# Patient Record
Sex: Female | Born: 1975 | Race: White | Smoking: Former smoker
Health system: Northeastern US, Academic
[De-identification: ages and names within clinical notes are randomized; demographics above are authoritative.]

## PROBLEM LIST (undated history)

## (undated) DIAGNOSIS — F32A Depression, unspecified: Secondary | ICD-10-CM

## (undated) DIAGNOSIS — K219 Gastro-esophageal reflux disease without esophagitis: Secondary | ICD-10-CM

## (undated) DIAGNOSIS — R0683 Snoring: Secondary | ICD-10-CM

## (undated) DIAGNOSIS — R5383 Other fatigue: Secondary | ICD-10-CM

## (undated) DIAGNOSIS — R609 Edema, unspecified: Secondary | ICD-10-CM

## (undated) DIAGNOSIS — R519 Headache, unspecified: Secondary | ICD-10-CM

## (undated) DIAGNOSIS — R760 Raised antibody titer: Secondary | ICD-10-CM

## (undated) DIAGNOSIS — Z9889 Other specified postprocedural states: Secondary | ICD-10-CM

## (undated) DIAGNOSIS — F419 Anxiety disorder, unspecified: Secondary | ICD-10-CM

## (undated) DIAGNOSIS — R7303 Prediabetes: Secondary | ICD-10-CM

## (undated) DIAGNOSIS — N393 Stress incontinence (female) (male): Secondary | ICD-10-CM

## (undated) DIAGNOSIS — T8859XA Other complications of anesthesia, initial encounter: Secondary | ICD-10-CM

## (undated) DIAGNOSIS — R0602 Shortness of breath: Secondary | ICD-10-CM

## (undated) DIAGNOSIS — R112 Nausea with vomiting, unspecified: Secondary | ICD-10-CM

## (undated) DIAGNOSIS — G4730 Sleep apnea, unspecified: Secondary | ICD-10-CM

## (undated) DIAGNOSIS — H539 Unspecified visual disturbance: Secondary | ICD-10-CM

## (undated) HISTORY — PX: COLONOSCOPY: SHX174

## (undated) HISTORY — DX: Shortness of breath: R06.02

## (undated) HISTORY — DX: Snoring: R06.83

## (undated) HISTORY — DX: Anxiety disorder, unspecified: F41.9

## (undated) HISTORY — DX: Edema, unspecified: R60.9

## (undated) HISTORY — DX: Depression, unspecified: F32.A

## (undated) HISTORY — DX: Morbid (severe) obesity due to excess calories: E66.01

## (undated) HISTORY — DX: Other fatigue: R53.83

## (undated) HISTORY — DX: Stress incontinence (female) (male): N39.3

## (undated) HISTORY — DX: Unspecified visual disturbance: H53.9

## (undated) HISTORY — DX: Headache, unspecified: R51.9

## (undated) HISTORY — DX: Other complications of anesthesia, initial encounter: T88.59XA

## (undated) HISTORY — DX: Gastro-esophageal reflux disease without esophagitis: K21.9

## (undated) HISTORY — DX: Sleep apnea, unspecified: G47.30

## (undated) HISTORY — DX: Prediabetes: R73.03

## (undated) HISTORY — DX: Raised antibody titer: R76.0

---

## 2001-03-10 HISTORY — PX: BREAST SURGERY: SHX581

## 2001-07-08 HISTORY — PX: BREAST REDUCTION SURGERY: SHX8

## 2009-03-10 DIAGNOSIS — I471 Supraventricular tachycardia: Secondary | ICD-10-CM

## 2009-03-10 HISTORY — DX: Supraventricular tachycardia: I47.1

## 2012-03-10 HISTORY — PX: EYE SURGERY: SHX253

## 2013-09-06 ENCOUNTER — Ambulatory Visit: Payer: Self-pay | Admitting: Surgery

## 2013-09-29 ENCOUNTER — Ambulatory Visit: Payer: Self-pay | Admitting: Surgery

## 2013-09-29 ENCOUNTER — Encounter: Payer: Self-pay | Admitting: Surgery

## 2013-09-29 ENCOUNTER — Ambulatory Visit
Admit: 2013-09-29 | Discharge: 2013-09-29 | Disposition: A | Discharge status: Home / Self Care | Payer: Self-pay | Source: Ambulatory Visit | Attending: Surgery | Admitting: Surgery

## 2013-09-29 DIAGNOSIS — G4730 Sleep apnea, unspecified: Secondary | ICD-10-CM

## 2013-09-29 LAB — TSH: TSH: 1.73 u[IU]/mL (ref 0.27–4.20)

## 2013-09-29 NOTE — H&P (Signed)
[facility] at [facility]  History and Physical    Name: Brooke Hanson  MRN: [id number]  DOB: 1975-12-02  Date of Encounter: September 29, 2013  Medical Providers:  Referring: Levy SjogrenGraff, Kerry W  PCP: Levy SjogrenGraff, Kerry W (General)      CC:  Morbid obesity with desire for weight loss    HPI:  Brooke Hanson is a 38 y.o. female who presents at the request of Levy SjogrenGraff, Kerry W [facility](General) for bariatric surgical consultation.  Her body mass index is 38 kg/(m^2).Marland Kitchen. The patient presents with obesity.  The severity is morbid.  The location is central and peripheral.  The duration has been greater than 5 years.  The quality has been stable.  She has failed medical treatment. She does have these associated symptoms obstructive sleep apnea, depression, anxiety  .    Desired procedure: The patient is interested in Laparoscopic Sleeve Gastrectomy       with Sol PasserWilliam E. Gershon Mussel'Malley, M.D., F.A.C.S.  .    Medical Diagnoses:  1. Morbid obesity    2. Sleep apnea         PMH:  Past Medical History   Diagnosis Date    Morbid obesity     Sleep apnea     Supraventricular tachycardia 2011    Depression     Anxiety     Snoring     Fatigue     Complication of anesthesia      nausea and emesis    Peripheral edema      during pregnancy       Weight History:   Brooke Hanson has been obese:  Obesity History 09/16/2013   Obesity History Morbidly Obese >5 years;Obese since childhood        Weight loss programs/medications:   Brooke Hanson has attempted the following weight loss programs and medications in the past:  Weight Loss Diets 09/16/2013   Weight Loss Diets Weight Watchers;Other        Weight Loss Medications 09/16/2013   Weight Loss Medications (No Data)       Exercise History:   Excercise History 09/16/2013   Overweight At Least 5 Years Yes   Currently Excercising Yes   Type of Activity Other   Other Activity tall trainer   Frequency (Per Week) 3   Duration (Minutes) 60   Functional Limits No Impairment (Can Walk 200 ft Without Assistance)         PSH:  Past Surgical History   Procedure Laterality Date    Eye surgery Bilateral 2014     tear duct     Breast surgery  2003     reduction        Current Medications, Vitamins, and Supplements list:  Medications/Vitamins/Supplements        Last Dose Start Date End Date Provider     atenolol (TENORMIN) 50 MG tablet Taking  08/11/13  --  [provider]     50 mg as needed        cetirizine (ZYRTEC) 10 MG tablet Taking  --  --  [provider]     Take 10 mg by mouth as needed for Allergies     citalopram (CELEXA) 40 MG tablet Taking  08/11/13  --  [provider]     40 mg daily        ibuprofen (ADVIL) 200 MG tablet Taking  --  --  [provider]     Take 200 mg by mouth as  needed for Pain          Allergies:  Allergies   Allergen Reactions    Seasonal Other (See Comments)     Watery eyes runny nose and sneezing       Disability History:   Disability 09/16/2013   Is the Patient Disabled? No   Hearing Impaired? No       Family History:  Family History   Problem Relation Age of Onset    High blood pressure Mother     Anesth problems Mother     Arthritis Mother     Obesity Mother     Cancer Paternal Grandmother      breast    Morbid Obesity Paternal Grandmother     Aneurysm Paternal Grandfather      heart    Heart disease Paternal Grandfather     Morbid Obesity Paternal Grandfather     Arthritis Maternal Grandfather     COPD Maternal Grandfather     Depression Maternal Grandfather     Stroke Maternal Grandfather     Mental illness Father     High blood pressure Maternal Grandmother         Social History:  Brooke Hanson currently resides in [place] [address].  Occupation:   Marital Status: Divorced  Children:   Support Person: [name] mother   Tobacco History: She  reports that she quit smoking about 16 years ago. She has never used smokeless tobacco.  Alcohol History: Brooke Hanson  reports that she drinks alcohol.  Ellicit drug use: She  reports that she does  not use illicit drugs.     Review of Systems:  Review of Systems   Constitutional: Negative for fever, chills and diaphoresis.   HENT: Negative for hearing loss, nosebleeds and tinnitus.    Eyes: Negative for blurred vision and double vision.   Respiratory: Negative for cough, shortness of breath and wheezing.    Cardiovascular: Negative for chest pain, palpitations and leg swelling.   Gastrointestinal: Negative for heartburn, nausea, vomiting, abdominal pain, diarrhea, constipation and blood in stool.   Genitourinary: Negative for dysuria, urgency and frequency.   Musculoskeletal: Positive for back pain. Negative for myalgias, joint pain and neck pain.   Skin: Negative for itching and rash.   Neurological: Positive for headaches. Negative for dizziness, tingling, tremors, seizures and loss of consciousness.   Psychiatric/Behavioral: Negative for depression and substance abuse. The patient is not nervous/anxious and does not have insomnia.        Physical Exam:  Blood pressure 123/63, pulse 71, temperature 36.3 C (97.3 F), resp. rate 18, height 1.549 m (5\' 1" ), weight 91.173 kg (201 lb), SpO2 99 %.   Percent Excess Weight Loss: 0 Percent     Physical Exam   Constitutional: She is oriented to person, place, and time. She appears well-developed and well-nourished.   HENT:   Head: Normocephalic and atraumatic.   Eyes: Conjunctivae are normal. Pupils are equal, round, and reactive to light.   Neck: Normal range of motion. Neck supple. No tracheal deviation present. No thyromegaly present.   Cardiovascular: Normal rate, regular rhythm and normal heart sounds.    No murmur heard.  Pulmonary/Chest: Effort normal and breath sounds normal. No respiratory distress. She has no wheezes. She has no rales.   Abdominal: Soft. Bowel sounds are normal. She exhibits no distension and no mass. There is no tenderness. There is no rebound.   Musculoskeletal: Normal range of motion. She exhibits no edema.  Lymphadenopathy:     She has  no cervical adenopathy.   Neurological: She is alert and oriented to person, place, and time.   Skin: Skin is warm and dry. No rash noted. No erythema.   Psychiatric: She has a normal mood and affect. Her behavior is normal. Thought content normal.        Assessment:  Brooke Hanson is a 38 y.o. female who presents for bariatric surgery.   Planned Procedure(s): Laparoscopic Sleeve Gastrectomy.     Documentation Reviewed:  I have reviewed the required letter of support from the patients Primary Care Physician.    Prior Labs and Reports Reviewed  No results found.    Discussion:  After a thorough evaluation patient appears to be an appropriate candidate for weight loss surgery, pending results of requested testing and/or all requested support letters.  We had a brief discussion regarding the risks and benefits of weight loss surgery, answering all questions raised, and the patient voiced a clear understanding.  Further, more detailed discussion will take place during the consultation visit with the surgeon.     Plan:  Orders Placed This Encounter   Procedures    US abdominal complete    TSH    H. pylori antibody, IgG     Procedure: Laparoscopic Sleeve Gastrectomy         She was instructed to discontinue NSAIDS as pain medication after surgery due to risk of ulceration of the gastric pouch.  It was explained that ulcers can perforate, bleed, and/or cause strictures.  It was explained that Tylenol based medications are acceptable.  She was instructed to begin a daily multi vitamin regimen now and that she will be required to follow written instructions regarding a multi vitamin/supplementation regimen post surgery.  Brooke Hanson was instructed to bring her CPAP device with her on the day of surgery.    Return in about 1 week (around 10/06/2013).

## 2013-09-29 NOTE — Patient Instructions (Signed)
[facility]  [address]  [facility] [place], [place] [id number]    Phone: [phone]    Fax: [phone]  ______________________________________________________________________  Darrell JewelWilliam E. OMalley, MD, FACS Pamala DuffelJoseph Johnson, MD, FACS Lael E. Algis GreenhouseForbes, MD, FACS  Delight HohHeather Allerton, PA-C Theodoro ParmaElizabeth Hughes, FNP Rolland PorterJulie Anne Georgios Kina, [place] JerseyPA-C    Bariatric Center Pre-Operative Checklist    Name: Brooke HanksMichelle Hanson  DOB: 27-Dec-1975  Date: 09/29/2013    Thank you for choosing [facility].  In order to assess your candidacy for surgery, we will need you to complete our pre-operative program.  This program includes nutrition appointments, psychological evaluation and medical testing.  Please have the following completed prior to your appointment with your surgeon.     []  Support Letter from your Primary Care Physician    [x]  DONE    [x]  Schedule and complete your psychological evaluation (please refer to the green form)    []   Psych evaluation - DONE    [x]  Ultrasound of your gallbladder (Please call our radiology department at [phone]     to make an appointment)    []  Previous Cholecystectomy (Your gallbladder has previously been removed)    []  Nicotine screen []  Bloodwork drawn today  []  Please wait until you are 6 weeks         smoke free (lab slip given)    []  Sleep Study        []  Sleep Center will contact you to schedule  [] [facility]   [] [facility] [] ______               []  Call your PCP for a sleep study appointment       []  In progress - patient to forward report                []  Yearly follow up      [x]  Bring machine on the day of surgery  [x] C-PAP  []  Bi-Pap       []  if you are diagnosed with sleep apnea after your sleep study    []  Cardiac clearance []  Call your Cardiologist for an appointment []  Call your PCP for        an appointment    []  Endoscopy []  Report of most recent endoscopy    []  Colonoscopy []  Report of most recent colonoscopy [] recommended    []  Report of most recent Gyn  visits (within the past year) [] recommended    []  Report of most recent mammogram (within the past year) []  recommended    [x]  Bloodwork drawn today [x]  TSH [x]  H.Pylori []  HgbA1c          []  TSH previously drawn ______uIU/ml  on __ /___           []  patient to forward        []  H. Pylori previously drawn ______ on __/____               []  patient to forward        []  HgbA1c previously drawn  ______% on __/_____          []  patient to forward         If your blood work is normal you will not hear from us       If your TSH or HgbA1c is abnormal your results will be sent to your PCP       If you test positive for H. Pylori we will send prescriptions directly to your  pharmacy,       along with a letter to you in the mail    []  Other:    [x]  Start Multivitamin    [x]  Calcium Citrate (634m calcium/400IU Vit D 2x/day)    YOU CAN SCHEDULE YOUR CONSULTATION WITH YOUR SURGEON AFTER YOUR CHECKLIST AND NUTRITION REQUIREMENTS HAVE BOTH BEEN COMPLETED

## 2013-09-30 LAB — H. PYLORI ANTIBODY, IGG: H Pylori IgG: POSITIVE — AB

## 2013-10-05 ENCOUNTER — Other Ambulatory Visit: Payer: Self-pay | Admitting: Surgery

## 2013-10-05 ENCOUNTER — Encounter: Payer: Self-pay | Admitting: Surgery

## 2013-10-05 MED ORDER — MINOCYCLINE HCL 100 MG PO CAPS *I*
100.0000 mg | ORAL_CAPSULE | Freq: Two times a day (BID) | ORAL | Status: AC
Start: 2013-10-05 — End: 2013-10-19

## 2013-10-05 MED ORDER — METRONIDAZOLE 500 MG PO TABS *I*
500.0000 mg | ORAL_TABLET | Freq: Two times a day (BID) | ORAL | Status: AC
Start: 2013-10-05 — End: 2013-10-19

## 2013-10-05 MED ORDER — BISMUTH SUBSALICYLATE 262 MG/15ML PO SUSP *I*
30.0000 mL | Freq: Three times a day (TID) | ORAL | Status: AC
Start: 2013-10-05 — End: 2013-10-19

## 2013-10-07 ENCOUNTER — Encounter: Payer: Self-pay | Admitting: Gastroenterology

## 2014-02-14 ENCOUNTER — Other Ambulatory Visit: Payer: Self-pay | Admitting: Family

## 2014-02-14 LAB — URINALYSIS WITH REFLEX TO MICROSCOPIC
Bilirubin,Ur: NEGATIVE
Blood,UA: NEGATIVE
Glucose,UA: NEGATIVE mg/dL^mg/dL
Ketones, UA: NEGATIVE mg/dL^mg/dL
Leuk Esterase,UA: NEGATIVE
Nitrite,UA: NEGATIVE
Protein,UA: NEGATIVE mg/dL^mg/dL
Specific Gravity,UA: 1.014 (ref 1.005–1.030)
Urobilinogen,UA: 0.2 mg/dL^mg/dL
pH,UA: 6 (ref 5.0–8.0)

## 2014-02-15 LAB — HEPATITIS B SURFACE ANTIGEN: HBV S AG: NONREACTIVE

## 2014-02-15 LAB — VAGINITIS SCREEN: DNA PROBE

## 2014-02-15 LAB — RPR: RPR Screen: NONREACTIVE

## 2014-02-15 LAB — HEPATITIS C ANTIBODY: Hepatitis C Ab: <0.02 s/co ratio^s/co ratio (ref 0.00–0.79)

## 2014-02-16 LAB — HSV 2 ANTIBODY, IGG: HSV 2 IgG: 0.34 IV^IV

## 2014-02-16 LAB — HSV 1 ANTIBODY, IGG: HSV 1 IgG: 0.12 IV^IV

## 2015-03-13 ENCOUNTER — Other Ambulatory Visit: Payer: Self-pay | Admitting: Obstetrics and Gynecology

## 2015-03-14 LAB — HEPATITIS C ANTIBODY: Hepatitis C Ab: <0.02 s/co ratio^s/co ratio (ref 0.00–0.79)

## 2015-03-14 LAB — HEPATITIS B SURFACE ANTIBODY: Hep B S Ab: 10.84 mIU/mL^mIU/mL

## 2015-03-14 LAB — HEPATITIS B SURFACE ANTIGEN: HBV S AG: NONREACTIVE

## 2015-03-15 LAB — SYPHILIS SCREEN
Syphilis Screen: NEGATIVE
Syphilis Status: NONREACTIVE

## 2015-03-16 LAB — GYN CYTOLOGY

## 2015-04-13 LAB — HSV 2 ANTIBODY, IGG: HSV 2 IgG: 0.2 IV^IV

## 2015-04-13 LAB — HSV 1 ANTIBODY, IGG: HSV 1 IgG: 0.06 IV^IV

## 2015-04-25 ENCOUNTER — Other Ambulatory Visit: Payer: Self-pay | Admitting: Obstetrics and Gynecology

## 2015-04-26 LAB — SURGICAL PATHOLOGY

## 2015-11-05 ENCOUNTER — Other Ambulatory Visit: Payer: Self-pay | Admitting: Family Medicine

## 2015-11-05 LAB — LIPID PANEL
Chol/HDL Ratio: 4.8 ^^L — ABNORMAL HIGH (ref 1.0–3.5)
Cholesterol: 214 mg/dL — ABNORMAL HIGH (ref 0–199)
HDL: 45 mg/dL (ref 40–60)
LDL Calculated: 134 mg/dL — ABNORMAL HIGH (ref 0–129)
Triglycerides: 175 mg/dL — ABNORMAL HIGH (ref 0–149)

## 2015-11-05 LAB — CREATININE, SERUM
Creatinine: 0.82 mg/dL (ref 0.51–0.95)
GFR,Black: 95 mL/min/{1.73_m2}
GFR,Caucasian: 78 mL/min/{1.73_m2}

## 2015-11-05 LAB — SODIUM: Sodium: 137 mmol/L (ref 133–145)

## 2015-11-05 LAB — CBC AND DIFFERENTIAL
Baso # K/uL: 0.1 10*3/uL (ref 0.0–0.1)
Basophil %: 0.6 % (ref 0.1–1.2)
Eos # K/uL: 0.1 10*3/uL (ref 0.0–0.4)
Eosinophil %: 1.6 % (ref 0.7–5.8)
Hematocrit: 37.6 % (ref 34.1–44.9)
Hemoglobin: 13.3 g/dL (ref 11.2–15.7)
Immature Granulocytes Absolute: 0.06 10*3/uL (ref 0.0–0.2)
Immature Granulocytes: 0.7 % (ref 0.0–2.0)
Lymph # K/uL: 2.8 10*3/uL (ref 1.2–3.7)
Lymphocyte %: 32.6 % (ref 19.3–51.7)
MCH: 32.2 pg (ref 25.6–32.2)
MCHC: 35.4 g/dL (ref 32.2–35.5)
MCV: 91 fL (ref 79.4–94.8)
Mono # K/uL: 0.8 10*3/uL (ref 0.2–0.9)
Monocyte %: 8.8 % (ref 4.7–12.5)
Neut # K/uL: 4.8 10*3/uL (ref 1.6–6.0)
Nucl RBC # K/uL: 0 /100 WBC (ref 0.0–0.2)
Platelets: 260 10*3/uL (ref 182–369)
RBC Distribution Width-SD: 40.8 fL (ref 36.4–46.3)
RBC: 4.13 10*6/uL (ref 3.93–5.22)
RDW: 12.3 % (ref 11.7–14.4)
Seg Neut %: 55.7 % (ref 34.0–71.1)
WBC: 8.5 10*3/uL (ref 4.0–10.0)

## 2015-11-05 LAB — ALT: ALT: 23 U/L (ref 0–35)

## 2015-11-05 LAB — BUN: Lab: 16 mg/dL (ref 6–20)

## 2015-11-05 LAB — GLUCOSE: Glucose: 111 mg/dL — ABNORMAL HIGH (ref 60–99)

## 2015-11-05 LAB — TSH: TSH: 2.24 u[IU]/mL (ref 0.27–4.20)

## 2015-11-05 LAB — POTASSIUM: Potassium: 4.1 mmol/L (ref 3.4–4.7)

## 2016-03-05 ENCOUNTER — Other Ambulatory Visit: Payer: Self-pay

## 2016-03-12 LAB — GYN CYTOLOGY

## 2016-03-28 ENCOUNTER — Other Ambulatory Visit: Payer: Self-pay | Admitting: Gastroenterology

## 2016-03-28 LAB — INFLUENZA  A & B/RSV PCR
INFLUENZA B PCR: NEGATIVE ^^L
Influenza A PCR: NEGATIVE ^^L
RSV Rapid Antigen: NEGATIVE ^^L

## 2016-03-31 ENCOUNTER — Other Ambulatory Visit: Payer: Self-pay | Admitting: Family Medicine

## 2016-03-31 LAB — RAPID STREP SCREEN: Rapid Strep Group A Throat: NEGATIVE ^^L

## 2016-06-10 ENCOUNTER — Encounter: Payer: Self-pay | Admitting: Gastroenterology

## 2016-07-15 ENCOUNTER — Encounter: Payer: Self-pay | Admitting: Surgery

## 2016-07-24 ENCOUNTER — Encounter: Payer: Self-pay | Admitting: Gastroenterology

## 2016-07-24 ENCOUNTER — Encounter: Payer: Self-pay | Admitting: Surgery

## 2016-07-24 ENCOUNTER — Other Ambulatory Visit: Payer: Self-pay

## 2016-07-24 ENCOUNTER — Ambulatory Visit: Payer: No Typology Code available for payment source | Attending: Surgery | Admitting: Surgery

## 2016-07-24 DIAGNOSIS — K219 Gastro-esophageal reflux disease without esophagitis: Secondary | ICD-10-CM | POA: Insufficient documentation

## 2016-07-24 DIAGNOSIS — Z6841 Body Mass Index (BMI) 40.0 and over, adult: Secondary | ICD-10-CM | POA: Insufficient documentation

## 2016-07-24 DIAGNOSIS — G4730 Sleep apnea, unspecified: Secondary | ICD-10-CM | POA: Insufficient documentation

## 2016-07-24 DIAGNOSIS — R7303 Prediabetes: Secondary | ICD-10-CM | POA: Insufficient documentation

## 2016-07-24 LAB — TSH: TSH: 3.37 u[IU]/mL (ref 0.27–4.20)

## 2016-07-24 NOTE — Addendum Note (Signed)
Addended by: Lauree Yurick D on: 07/24/2016 08:58 AM     Modules accepted: Orders

## 2016-07-24 NOTE — H&P (Signed)
[facility]  History and Physical    Name: Brooke Hanson  MRN: [id number]  DOB: 12-27-1975  Date of Encounter: Jul 24, 2016  Medical Providers:  Referring: No ref. provider found  PCP: Levy SjogrenGraff, Kerry W, MD      CC:  Morbid obesity with desire for weight loss    HPI:  Brooke Hanson is a 41 y.o. female who presents at the request of Lind GuestGraff, Otho PerlKerry W, MD for bariatric surgical consultation.  Her body mass index is 43.05 kg/(m^2).Marland Kitchen. The patient presents with obesity.  The severity is morbid.  The location is central and peripheral.  The duration has been greater than 5 years.  The quality has been stable.  She has failed medical treatment. She does have these associated symptoms obstructive sleep apnea, depression, anxiety, GERD  .    Desired procedure: The patient is interested in Laparoscopic Roux-en-Y Gastric Bypass       with Sol PasserWilliam E. Gershon Mussel'Malley, M.D., F.A.C.S.  .    Medical Diagnoses:  1. Morbid obesity    2. Morbid obesity with BMI of 40.0-44.9, adult    3. Sleep apnea, unspecified type    4. Gastroesophageal reflux disease, esophagitis presence not specified    5. Prediabetes         PMH:  Past Medical History:   Diagnosis Date    Anxiety     Complication of anesthesia     nausea and emesis    Depression     Fatigue     GERD (gastroesophageal reflux disease)     Headache     Morbid obesity     Peripheral edema     during pregnancy    Positive H. pylori titer     treated - 2015    Prediabetes     Shortness of breath     Sleep apnea     Snoring     Stress incontinence     Supraventricular tachycardia 2011    Vision abnormalities        Weight History:   Brooke Hanson has been obese:  Obesity History 07/15/2016   Obesity History Morbidly Obese >5 years        Weight loss programs/medications:   Brooke Hanson has attempted the following weight loss programs and medications in the past:  Weight Loss Diets 07/15/2016   Weight Loss Diets Weight Watchers;Other        Weight Loss  Medications 09/16/2013   Weight Loss Medications (No Data)       Exercise History:   Excercise History 07/15/2016   Overweight At Least 5 Years Yes   Currently Excercising No   Type of Activity -   Other Activity -   Frequency (Per Week) -   Duration (Minutes) -   Functional Limits No Impairment (Can Walk 200 ft Without Assistance)        PSH:  Past Surgical History:   Procedure Laterality Date    BREAST SURGERY  2003    reduction    EYE SURGERY Bilateral 2014    tear duct         Current Medications, Vitamins, and Supplements list:  Medications/Vitamins/Supplements         Last Dose Start Date End Date Provider     Calcium Carbonate Antacid (TUMS E-X PO) Taking  --  --  [provider]     Take by mouth     cetirizine (ZYRTEC) 10 MG tablet As Needed  --  --  [provider]     Take 10 mg by mouth as needed for Allergies     citalopram (CELEXA) 40 MG tablet Taking  08/11/13  --  [provider]     40 mg daily        ibuprofen (ADVIL) 200 MG tablet As Needed  --  --  [provider]     Take 200 mg by mouth as needed for Pain          Allergies:  Allergies   Allergen Reactions    Seasonal Allergies Other (See Comments)     Watery eyes runny nose and sneezing       Disability History:   Disability 07/15/2016   Is the Patient Disabled? No   Hearing Impaired? No       Family History:  Family History   Problem Relation Age of Onset    High Blood Pressure Mother     Anesth problems Mother     Arthritis Mother     Obesity Mother     High cholesterol Mother     Aneurysm Paternal Grandfather      heart    Heart Disease Paternal Grandfather     Morbid Obesity Paternal Grandfather     Arthritis Paternal Grandfather     Early death Paternal Grandfather     High Blood Pressure Paternal Grandfather     Arthritis Maternal Grandfather     COPD Maternal Grandfather     Depression Maternal Grandfather     Stroke Maternal Grandfather     Asthma Maternal Grandfather     High Blood  Pressure Maternal Grandmother     Arthritis Maternal Grandmother     Cancer Paternal Grandmother      breast    Morbid Obesity Paternal Grandmother     Arthritis Paternal Grandmother     Mental illness Father         Social History:  Brooke Hanson currently resides in [place] [address].  Occupation:   Marital Status: Divorced  Children:   Support Person: [name] mother   Tobacco History: She  reports that she quit smoking about 19 years ago. She has never used smokeless tobacco.  Alcohol History: Brooke Hanson  reports that she drinks alcohol.  Ellicit drug use: She  reports that she does not use illicit drugs.     Review of Systems:  Review of Systems   Constitutional: Negative for chills, diaphoresis and fever.   HENT: Negative for hearing loss, nosebleeds and tinnitus.    Eyes: Negative for blurred vision and double vision.   Respiratory: Negative for cough, shortness of breath and wheezing.    Cardiovascular: Positive for leg swelling. Negative for chest pain and palpitations.   Gastrointestinal: Positive for heartburn and nausea. Negative for abdominal pain, blood in stool, constipation, diarrhea and vomiting.   Genitourinary: Negative for dysuria, frequency and urgency.   Musculoskeletal: Positive for back pain and joint pain. Negative for myalgias and neck pain.   Skin: Negative for itching and rash.   Neurological: Positive for headaches. Negative for dizziness, tingling, tremors, seizures and loss of consciousness.   Psychiatric/Behavioral: Negative for depression and substance abuse. The patient is not nervous/anxious and does not have insomnia.        Physical Exam:  Blood pressure 134/67, pulse 82, temperature 35.8 C (96.4 F), resp. rate 18, height 1.562 m (5' 1.5"), weight 105.1 kg (231 lb 9.6 oz), SpO2 97 %.   Percent Excess Weight  Loss: 32.74 Percent     Physical Exam   Constitutional: She is oriented to person, place, and time. She appears well-developed and well-nourished.   HENT:   Head:  Normocephalic and atraumatic.   Eyes: EOM are normal.   Neck: Normal range of motion. Neck supple. No tracheal deviation present. No thyromegaly present.   Cardiovascular: Normal rate, regular rhythm and normal heart sounds.    No murmur heard.  Pulmonary/Chest: Effort normal and breath sounds normal. No respiratory distress. She has no wheezes. She has no rales.   Breast reduction scars   Abdominal: Soft. Bowel sounds are normal. She exhibits no distension and no mass. There is no tenderness. There is no rebound.   Musculoskeletal: Normal range of motion. She exhibits no edema.   Lymphadenopathy:     She has no cervical adenopathy.   Neurological: She is alert and oriented to person, place, and time.   Skin: Skin is warm and dry. No rash noted. No erythema.   Psychiatric: She has a normal mood and affect. Her behavior is normal. Thought content normal.        Assessment:  Brooke Hanson is a 40 y.o. female who presents for bariatric surgery.   Planned Procedure(s): Laparoscopic Roux-en-Y Gastric Bypass.     Documentation Reviewed:  I have reviewed the required letter of support from the patients Primary Care Physician.    Prior Labs and Reports Reviewed    Lab Results   Component Value Date    HEL POS (!) 09/29/2013      Lab Results   Component Value Date    TSH 2.24 11/05/2015         Discussion:  After a thorough evaluation patient appears to be an appropriate candidate for weight loss surgery, pending results of requested testing and/or all requested support letters.  We had a brief discussion regarding the risks and benefits of weight loss surgery, answering all questions raised, and the patient voiced a clear understanding.  Further, more detailed discussion will take place during the consultation visit with the surgeon.     Plan:  Orders Placed This Encounter   Procedures    H. pylori antigen, stool    US abdominal complete    TSH    Hemoglobin A1c     Procedure: Laparoscopic Roux-en-Y Gastric Bypass          She was instructed to discontinue NSAIDS as pain medication after surgery due to risk of ulceration of the gastric pouch.  It was explained that ulcers can perforate, bleed, and/or cause strictures.  It was explained that Tylenol based medications are acceptable.  She was instructed to begin a daily multi vitamin regimen now and that she will be required to follow written instructions regarding a multi vitamin/supplementation regimen post surgery.  Brooke Hanson was instructed to bring her CPAP device with her on the day of surgery.    Return in about 4 weeks (around 08/21/2016).

## 2016-07-25 LAB — HEMOGLOBIN A1C: Hemoglobin A1C: 5.6 % (ref 4.0–6.0)

## 2016-07-29 ENCOUNTER — Encounter: Payer: Self-pay | Admitting: Surgery

## 2016-07-31 ENCOUNTER — Other Ambulatory Visit: Payer: Self-pay | Admitting: Gastroenterology

## 2016-07-31 DIAGNOSIS — Z9884 Bariatric surgery status: Secondary | ICD-10-CM

## 2016-07-31 DIAGNOSIS — K760 Fatty (change of) liver, not elsewhere classified: Secondary | ICD-10-CM

## 2016-07-31 DIAGNOSIS — R160 Hepatomegaly, not elsewhere classified: Secondary | ICD-10-CM

## 2016-09-02 ENCOUNTER — Encounter: Payer: Self-pay | Admitting: Registered"

## 2016-09-29 ENCOUNTER — Encounter: Payer: Self-pay | Admitting: Registered"

## 2016-10-02 ENCOUNTER — Ambulatory Visit: Payer: No Typology Code available for payment source | Attending: Surgery | Admitting: Surgery

## 2016-10-02 ENCOUNTER — Encounter: Payer: Self-pay | Admitting: Surgery

## 2016-10-02 VITALS — BP 128/81 | HR 94 | Temp 96.1°F | Resp 18 | Ht 61.5 in | Wt 229.2 lb

## 2016-10-02 DIAGNOSIS — Z6841 Body Mass Index (BMI) 40.0 and over, adult: Secondary | ICD-10-CM

## 2016-10-02 DIAGNOSIS — G4730 Sleep apnea, unspecified: Secondary | ICD-10-CM

## 2016-10-02 NOTE — H&P (Signed)
[facility] at [facility]  Surgeon's History and Physical    Name: Brooke Hanson  MRN: [id number]  DOB: March 24, 1975  Date of Encounter: October 02, 2016  Medical Providers:  Referring: No ref. provider found  PCP: Brooke Sjogren, MD    CC:  Morbid obesity with desire for weight loss surgery.    HPI:  Brooke Hanson is a 41 y.o. female who presents at the request of Brooke Guest Otho Perl, MD for bariatric surgical consultation.  Her body mass index is 42.61 kg/(m^2).Marland Kitchen The patient presents with obesity.  The severity is morbid.  The location is central and peripheral.  The duration has been greater than 5 years.  The quality has been stable.  She has failed medical treatment. She does have these associated symptoms obstructive sleep apnea, depression, anxiety, GERD  .    Desired procedure: The patient is interested in Laparoscopic Roux-en-Y Gastric Bypass       with Brooke Hanson. Brooke Hanson, M.D., F.A.C.S.  .    Weight History:   Ms. Hanson has been obese:  Obesity History 07/15/2016   Obesity History Morbidly Obese >5 years       PMH:  Past Medical History:   Diagnosis Date    Anxiety     Complication of anesthesia     nausea and emesis    Depression     Fatigue     GERD (gastroesophageal reflux disease)     Headache     Morbid obesity     Peripheral edema     during pregnancy    Positive H. pylori titer     treated - 2015    Prediabetes     Shortness of breath     Sleep apnea     Snoring     Stress incontinence     Supraventricular tachycardia 2011    Vision abnormalities         PSH:  Past Surgical History:   Procedure Laterality Date    BREAST SURGERY  2003    reduction    EYE SURGERY Bilateral 2014    tear duct         Current Medications, Vitamins, and Supplements list:  Current Outpatient Prescriptions   Medication    escitalopram (LEXAPRO) 20 MG tablet    Multiple Vitamins-Minerals (MULTI FOR HER 50+ PO)    omeprazole (PRILOSEC) 20 MG capsule    Calcium Carbonate Antacid (TUMS E-X PO)     cetirizine (ZYRTEC) 10 MG tablet    ibuprofen (ADVIL) 200 MG tablet     No current facility-administered medications for this visit.        Allergies:  Allergies   Allergen Reactions    Seasonal Allergies Other (See Comments)     Watery eyes runny nose and sneezing       Disability History:   Disability 07/15/2016   Is the Patient Disabled? No   Hearing Impaired? No       Family History:  Family History   Problem Relation Age of Onset    High Blood Pressure Mother     Anesth problems Mother     Arthritis Mother     Obesity Mother     High cholesterol Mother     Aneurysm Paternal Grandfather      heart    Heart Disease Paternal Grandfather     Morbid Obesity Paternal Grandfather     Arthritis Paternal Grandfather     Early death Paternal Grandfather  High Blood Pressure Paternal Grandfather     Arthritis Maternal Grandfather     COPD Maternal Grandfather     Depression Maternal Grandfather     Stroke Maternal Grandfather     Asthma Maternal Grandfather     High Blood Pressure Maternal Grandmother     Arthritis Maternal Grandmother     Cancer Paternal Grandmother      breast    Morbid Obesity Paternal Grandmother     Arthritis Paternal Grandmother     Mental illness Father         Social History:  [place] [id number].  Occupation:   Marital Status: Divorced  Children:   Support Person: Brooke Hanson mother   Tobacco History: She  reports that she quit smoking about 19 years ago. She has never used smokeless tobacco.  Alcohol History: Brooke Hanson  reports that she drinks alcohol.  Ellicit drug use: She  reports that she does not use illicit drugs.    Review of Systems:  Review of Systems   Constitutional: Negative.    HENT: Negative.    Eyes: Negative.    Respiratory: Negative.    Cardiovascular: Positive for leg swelling.        Feet   Gastrointestinal: Positive for heartburn. Negative for abdominal pain, blood in stool, constipation, diarrhea, nausea and  vomiting.   Genitourinary: Negative.    Musculoskeletal: Positive for back pain and joint pain.   Skin: Negative.    Neurological: Positive for headaches.   Endo/Heme/Allergies: Positive for environmental allergies.   Psychiatric/Behavioral: Negative.           Physical Exam:  Blood pressure 128/81, pulse 94, temperature 35.6 C (96.1 F), resp. rate 18, height 1.562 m (5' 1.5"), weight 104 kg (229 lb 3.2 oz), SpO2 94 %.   Percent Excess Weight Loss: 30.17 Percent     Physical Exam   Vitals reviewed.  Constitutional: She is oriented to person, place, and time. She appears well-developed and well-nourished. No distress.   HENT:   Head: Normocephalic.   Eyes: No scleral icterus.   Neck: Normal range of motion. Neck supple. No tracheal deviation present. No thyromegaly present.   Cardiovascular: Normal rate, regular rhythm and normal heart sounds.    No murmur heard.  Pulmonary/Chest: Effort normal and breath sounds normal. No respiratory distress. She has no wheezes. She has no rales.   Abdominal: Soft. Bowel sounds are normal. She exhibits no distension and no mass. There is no tenderness.   Musculoskeletal: She exhibits no edema or tenderness.   Lymphadenopathy:     She has no cervical adenopathy.   Neurological: She is alert and oriented to person, place, and time.   Skin: Skin is warm and dry.   Psychiatric: She has a normal mood and affect. Her behavior is normal. Judgment and thought content normal.        Assessment:  Brooke Hanson is a 41 y.o. female who presents for bariatric surgery.    After a thorough evaluation I believe that Brooke Hanson is an appropriate candidate for weight loss surgery due to her morbid obesity and multiple associated comorbid conditions: obstructive sleep apnea, depression, anxiety, GERD  .    Documentation Reviewed  I have reviewed the required letter of support from the patients Primary Care Physician.  I verified that the patient underwent the required psychological  evaluation and was deemed to be a good candidate for bariatric surgery.   I  have personally reviewed each Registered Dietitian(who are under my supervision) note and agree with the nutritional assessment of the Dietitian that this patient has satisfied all pre-surgery requirements including nutrition education on the bariatric lifestyle meal plan, an exercise regimen and behavior modification techniques.  I attest that this patient has completed the multi-disciplinary surgical preparatory regimen in order to improve surgical outcomes, reduce the potential for surgical complications, and establish the patients ability to comply with post-operative medical care and dietary restrictions.    Prior Tests and Reports Reviewed  Lab Results   Component Value Date    HEL POS (!) 09/29/2013      Lab Results   Component Value Date    TSH 3.37 07/24/2016     No results found for: NICOT, COTIN, 3OHCT   No results found.       All pertinent outside tests, labs, or preop clearances reviewed.      Plan:  No orders placed this visit      Planned Procedure: Laparoscopic Roux-en-Y Gastric Bypass             Pre-operative Counseling:        Brooke Hanson is requesting the laparoscopic Roux-en-Y Gastric Bypass.  She has previously participated in a 3 hour informational seminar  regarding the surgical treatment of Morbid Obesity. I described the  laparoscopic Roux-en-Y Gastric Bypass using lay terms and with the aid  of diagrams, describing the anatomic changes and physiologic impact of  the procedure. I have described the gastric bypass procedure in great  detail including the creation of a small proximal stomach which will  severely limit the ability to take food at any given time. I have explained  that overfilling this small stomach would acutely and reliably lead to  epigastric pain, nausea, vomiting, and over the long term would tend to  stretch out the proximal stomach and result in a decrease in weight loss  and/or in actual  weight gain. I have explained the preoperative,  perioperative and postoperative phases of the surgery. I also explained  the dietary advancement in the first few weeks and months after the  surgery. I have explained that the average result for this procedure, in this  protocol, is an excess body weight loss of 65-75% maintained at one and  two years and that this result is only possible if the patient is able to  adhere to the dietary restriction imposed by the procedure and adopts a  regular exercise program. We discussed the risks of the procedure, which  include but are not limited to, bleeding from the spleen and rarely  requiring splenectomy, wound complications such as infection,  dehiscence, or hernia formation which may require further surgical  intervention, cardiac complications such as heart attack or dysrhythmia,  pulmonary complications such as pneumonia or pulmonary embolism  secondary to deep venous thrombosis, anastomotic complications such  as leak, stricture, bowel obstruction, or ulceration which may require  further surgical therapy or other intervention. I have likened the risks of a  gastric bypass to that of an open cardiac bypass with a 0.1 % death rate,  and a 10% major complication rate. We discussed the possible  postoperative micronutrient deficiencies such as iron causing anemia, or  vitamin B12 which may require long-term supplementation, hair loss or  thinning during the first one to two years during the period of most rapid  weight loss. She is aware that she may be required to pay for  supplements  for protein, vitamin and mineral supplementation that may  not be covered by her insurance. We have described the fact that  following surgical weight loss, there can be significant hanging skin, which  may require plastic surgical intervention, which may or may not be  covered by insurance as well. After the above discussion, she asked  appropriate questions, which were answered to her  satisfaction.  She voiced a clear understanding that surgery; like dieting,  exercise, behavior modification, medications, or a combination thereof, is  not a cure for obesity, but a treatment option and that any weight loss as a  result of the surgery can be regained if a patient is not compliant with a  lifestyle that promotes healthy weight loss and weight loss maintenance.  She voiced a clear understanding of the importance of exercise  as an adjunct to a healthy diet in order to achieve maximum health and  weight loss benefits. She understands that if, at the time of  surgery, laparoscopy is not possible or feasible then the procedure may  need to be converted to an open procedure.  She appears to have reasonable expectations of the outcome of  the surgery and wishes to go forward with the laparoscopic Roux-en-Y  gastric bypass. An informed consent for the surgery will be obtained.  She was instructed to discontinue NSAIDS as pain medication after surgery due to risk of ulceration of the gastric pouch. It was explained that ulcers can perforate, bleed, and/or cause strictures. It was explained that Tylenol based medications are acceptable.  She was instructed to bring her CPAP device with her on the day of surgery.  She was instructed to begin a daily multivitamin regimen now and that she will be required to follow written instructions regarding a multi vitamin/mineral/protein supplementation regimen post-surgery.  Brooke Hanson voiced a clear understanding that surgery is a tool and that weight loss is not guaranteed but only seen in the context of appropriate use, follow up, and exercise.  Brooke Hanson was counselled regarding the following:  Patient was advised that standard pre-operative tests will be ordered prior to surgery and if any are found to be abnormal, he may need further testing, which may delay his surgery.  Patient was instructed to discontinue NSAIDS as pain medication after surgery due  to risk of ulceration of the gastric pouch.  It was explained that ulcers can perforate, bleed, and/or cause strictures.  It was explained that Tylenol based medications are acceptable.  The patient was instructed to begin a daily multivitamin regimen now and that she will be required to follow written instructions regarding a multivitamin/supplementation regimen post surgery.

## 2016-10-07 ENCOUNTER — Ambulatory Visit
Admission: RE | Admit: 2016-10-07 | Discharge: 2016-10-07 | Disposition: A | Discharge status: Home / Self Care | Payer: No Typology Code available for payment source | Source: Ambulatory Visit | Attending: Surgery | Admitting: Surgery

## 2016-10-07 ENCOUNTER — Other Ambulatory Visit: Payer: Self-pay | Admitting: Cardiology

## 2016-10-07 DIAGNOSIS — Z01810 Encounter for preprocedural cardiovascular examination: Secondary | ICD-10-CM

## 2016-10-07 DIAGNOSIS — I499 Cardiac arrhythmia, unspecified: Secondary | ICD-10-CM

## 2016-10-07 DIAGNOSIS — Z01818 Encounter for other preprocedural examination: Secondary | ICD-10-CM | POA: Insufficient documentation

## 2016-10-07 HISTORY — DX: Other specified postprocedural states: Z98.890

## 2016-10-07 HISTORY — DX: Nausea with vomiting, unspecified: R11.2

## 2016-10-07 LAB — COMPREHENSIVE METABOLIC PANEL
ALT: 23 U/L (ref 0–35)
AST: 20 U/L (ref 0–35)
Albumin: 3.9 g/dL (ref 3.5–5.2)
Alk Phos: 68 U/L (ref 35–105)
Anion Gap: 11 (ref 7–16)
Bilirubin,Total: 0.3 mg/dL (ref 0.0–1.2)
CO2: 26 mmol/L (ref 20–28)
Calcium: 8.6 mg/dL — ABNORMAL LOW (ref 8.8–10.2)
Chloride: 103 mmol/L (ref 96–108)
Creatinine: 0.79 mg/dL (ref 0.51–0.95)
GFR,Black: 109 *
GFR,Caucasian: 95 *
Globulin: 3.1 g/dL (ref 2.7–4.3)
Glucose: 102 mg/dL — ABNORMAL HIGH (ref 60–99)
Lab: 12 mg/dL (ref 6–20)
Potassium: 4.1 mmol/L (ref 3.3–5.1)
Sodium: 140 mmol/L (ref 133–145)
Total Protein: 7 g/dL (ref 6.3–7.7)

## 2016-10-07 LAB — HEMATOCRIT: Hematocrit: 37 % (ref 34–45)

## 2016-10-07 LAB — TYPE AND SCREEN
ABO RH Blood Type: O NEG
Antibody Screen: NEGATIVE

## 2016-10-07 LAB — MCHC: MCHC: 34 g/dL (ref 32–36)

## 2016-10-07 NOTE — Discharge Instructions (Signed)
Pre-Operative Instructions - Gastric Bypass / Gastrectomy Sleeve                            PRIOR TO SURGERY     NOW, please STOP taking if surgeon does not specify:     Anti-inflammatory medications (Ibuprofen, Motrin, Advil, Mobic, Meloxicam, Aleve, Naproxen, Voltaren, etc.)   Vitamins and herbal supplements, including herbal teas    YOU MAY TAKE ACETAMINOPHEN (TYLENOL) as needed    FOLLOW YOUR SURGEON'S INSTRUCTIONS IF DIFFERENT THAN ABOVE.    DAY BEFORE SURGERY-7-31     Call [phone] between 2PM and 4PM to receive your arrival/surgery time.  Call Friday afternoon if your surgery is on Monday.   On waking, start a CLEAR liquid diet (broth, tea, coffee, juice, jello, water). You may remain on a clear liquid diet until 12:00 midnight.   Do not eat or drink anything (including candy or gum) after midnight the night before your surgery.    DAY OF SURGERY-8-1     If you are female, please be prepared to provide a urine sample on arrival to the preoperative area unless you are one year post menopause or have had a hysterectomy.    DO NOT WEAR: JEWELRY, MAKEUP, DARK NAIL POLISH, HAIR PINS, BODY LOTION OR SCENTS.     Please understand that rings and body piercings must be removed prior to surgery.  If they are not removed your surgery is at risk of being delayed or cancelled. Please see a jeweler before your surgery if you cannot remove an item yourself.   Please do not bring valuables/electronics/cash with you on the day of surgery. [facility] is not responsible for damage/loss/theft of these items. Thank you.If wearing eyeglasses, please bring a case.  DO NOT WEAR CONTACT LENSES.   You may shower, brush your teeth, and use deodorant.   PLEASE BRING YOUR CPAP MACHINE INTO THE PREOPERATIVE AREA TO BE CHECKED     MEDICATIONS: DAY OF SURGERY     Take your medications as directed on the  medication list.   Anxiety and pain medications may be taken as prescribed at any time prior to arrival.    Bring your inhalers and eye drops on the day of surgery to be used as prescribed.   All other medications will be administered to you from [facility] under the direction of your surgeon.    AT THE HOSPITAL ON THE DAY OF SURGERY     Park in the Main Ramp garage.  Enter the building through the [employer].  Please stop at the Information Desk so that they may direct you to Surgery Center on [address].   Leave your belongings in the car (except for your CPAP) and your visitors may bring them to your room after surgery.    [facility]    As a convenience, prior to your discharge we will provide any discharge medications you require.      The pharmacy is open from 9am to 5:30pm on weekdays and 10am-2pm on Saturdays.     If you will be alone on the day of surgery and want to leave with your prescribed medication you may:     Bring a check made out to [facility]   Use a credit card to pay for your prescription   Have your family call the pharmacy with a credit card number   Only bring cash to the hospital  as a last resort. Prescriptions cannot be filled without payment.  Thank you for your consideration.    QUESTIONS?     Question about these instructions? Call [phone], select option 2, and leave a message for a nurse to return your call.   Any questions regarding specifics about your surgery or recovery? Please call your surgeons office.

## 2016-10-07 NOTE — Plan of Care (Signed)
Problem: Knowledge deficit related to pre or post-op regimens  Goal: Patient verbalizes understanding of PACU teaching  Outcome: Completed or Resolved Date Met: 10/07/16

## 2016-10-07 NOTE — Anesthesia Preprocedure Evaluation (Addendum)
Anesthesia Pre-operative History and Physical for Brooke Hanson  History and Physical Performed at [facility])    Electrophysiology/AICD/Pacer:  EKG 10/07/2016    CPM Summary:  Brooke Hanson presents preoperatively for anesthesia evaluation prior to Lap Gastric Bypass by Dr. Ernestine ConradMalley, BMI 43.1, s/p 9# intentional weight loss in prep for bariatric surgery. She has a past medical history of Anxiety; Complication of anesthesia; Depression; Fatigue; GERD (gastroesophageal reflux disease); Headache; Morbid obesity; Peripheral edema; PONV (postoperative nausea and vomiting); Positive H. pylori titer; Prediabetes; Shortness of breath; Sleep apnea; Stress incontinence; Supraventricular tachycardia (2011).     Exercise tolerance:  The patient reports occas SOB w/ exertion if carrying a "heavy box", denies chest pain, is moderately active(works full time office job, does ADL's/gardening/housework), climbs stairs and can lie flat.  Limited procedures, reports PONV, denies any other personal/ family issues w/ anesthesia. Denies any cardiovascular issues.By ANN Ann HeldS CHATEAUNEUF, PA at 9:04 AM on 10/07/2016    Anesthesia Evaluation Information Source: patient, records     ANESTHESIA HISTORY    + history of anesthetic complications          PONV  Pertinent(-):  No Family hx of anesthetic complications    GENERAL    + Obesity          central  Comment: Denies any open sores, wounds or rashes at preop.  Pertinent (-):  No substance abuse    HEENT  Pertinent (-):  No hearing loss or neck pain PULMONARY    + Shortness of Breath (Only w/ exertion of carrying heavy boxes)    + Currently Active Environmental Allergies    + Snoring    + Sleep apnea          CPAPEPAP auto cmH2O  Pertinent(-):  No smoking, asthma, recent URI, cough/congestion, pulmonary testing or COPD    CARDIOVASCULAR  Good(4+METs) Exercise Tolerance    + Hx of Dysrhythmias          SVT  Pertinent(-):  No hypertension, past MI, cardiac testing or hx of  DVT    GI/HEPATIC/RENAL  Last PO Intake: >8hr before procedure and >2hr before procedure (clears)    + GERD          well controlled    + Alcohol use          social  Pertinent(-):  No liver  issues or renal issues NEURO/PSYCH    + Headaches          within last week and migraines    + Psychiatric Issues          depression  Pertinent(-):  No dizziness/motion sickness, syncope, seizures, cerebrovascular event, neuromuscular disease, gait/mobility issues or positioning issues    ENDO/OTHER    + Diabetes Mellitus (Prediabetes, no meds)    + Hormone Use          hormonal IUD  Pertinent(-):  No thyroid disease    HEMATOLOGIC  Pertinent(-):  No bruising/bleeding easily, coagulopathy or anticoagulants/antiplatelet medications       Physical Exam    Airway            Mouth opening: normal            Mallampati: II            TM distance (fb): >3 FB            Neck ROM: full            Comment: Thick neck/tongue  Dental  Normal Exam   Cardiovascular  Normal Exam           Rhythm: regular           Rate: normal  No friction rub, systolic click, weak pulses, peripheral edema, murmur or cardiac device sound      Neurologic    Normal Exam  No sensory deficit and motor deficit    General Survey    Normal Exam  No rashes, wounds   Pulmonary   Normal Exam    breath sounds clear to auscultation    No cough, rhonchi, decreased breath sounds, wheezes, rales    Mental Status   Normal Exam    oriented to person, place and time       ________________________________________________________________________  PLAN  ASA Score  3  Anesthetic Plan general    Protocols/Care Pathways (Bariatric Surgery) Induction (routine IV) General Anesthesia/Sedation Maintenance Plan (inhaled agents);  Airway Manipulation (direct laryngoscopy); Airway (cuffed ETT); Line ( use current access); Monitoring (standard ASA); Positioning (supine and arms out); PONV Plan (dexamethasone, ondansetron, haloperidol and promethazine); Pain (per surgical team and intraop  local); PostOp (PACU)    Informed Consent     Risks:          Risks discussed were commensurate with the plan listed above with the following specific points: aspiration, N/V and sore throat , damage to:(eyes, nerves, teeth), allergic Rx, awareness, unexpected serious injury    Anesthetic Consent:         Anesthetic plan (and risks as noted above) were discussed with patient and mother    Plan also discussed with team members including:       surgeon    Attending Attestation:  As the primary attending anesthesiologist, I attest that the patient or proxy understands and accepts the risks and benefits of the anesthesia plan. I also attest that I have personally performed a pre-anesthetic examination and evaluation, and prescribed the anesthetic plan for this particular location within 48 hours prior to the anesthetic as documented. Harley AltoPamela Ladaija Dimino, MD 12:03 PM

## 2016-10-08 ENCOUNTER — Inpatient Hospital Stay: Payer: No Typology Code available for payment source | Admitting: Gastroenterology

## 2016-10-08 ENCOUNTER — Encounter
Admission: RE | Disposition: A | Discharge status: Home / Self Care | Payer: Self-pay | Source: Ambulatory Visit | Attending: Surgery

## 2016-10-08 ENCOUNTER — Encounter: Payer: Self-pay | Admitting: Anesthesiology

## 2016-10-08 ENCOUNTER — Inpatient Hospital Stay: Payer: No Typology Code available for payment source | Admitting: Anesthesiology

## 2016-10-08 ENCOUNTER — Inpatient Hospital Stay
Admission: RE | Admit: 2016-10-08 | Discharge: 2016-10-10 | DRG: 403 | Disposition: A | Discharge status: Home / Self Care | Payer: No Typology Code available for payment source | Source: Ambulatory Visit | Attending: Surgery | Admitting: Surgery

## 2016-10-08 DIAGNOSIS — R7303 Prediabetes: Secondary | ICD-10-CM | POA: Diagnosis present

## 2016-10-08 DIAGNOSIS — R0602 Shortness of breath: Secondary | ICD-10-CM | POA: Diagnosis present

## 2016-10-08 DIAGNOSIS — I471 Supraventricular tachycardia: Secondary | ICD-10-CM | POA: Diagnosis present

## 2016-10-08 DIAGNOSIS — G43909 Migraine, unspecified, not intractable, without status migrainosus: Secondary | ICD-10-CM | POA: Diagnosis present

## 2016-10-08 DIAGNOSIS — G4733 Obstructive sleep apnea (adult) (pediatric): Secondary | ICD-10-CM | POA: Diagnosis present

## 2016-10-08 DIAGNOSIS — K760 Fatty (change of) liver, not elsewhere classified: Secondary | ICD-10-CM

## 2016-10-08 DIAGNOSIS — Z6841 Body Mass Index (BMI) 40.0 and over, adult: Secondary | ICD-10-CM

## 2016-10-08 DIAGNOSIS — F1010 Alcohol abuse, uncomplicated: Secondary | ICD-10-CM | POA: Diagnosis present

## 2016-10-08 DIAGNOSIS — J309 Allergic rhinitis, unspecified: Secondary | ICD-10-CM | POA: Diagnosis present

## 2016-10-08 DIAGNOSIS — F329 Major depressive disorder, single episode, unspecified: Secondary | ICD-10-CM | POA: Diagnosis present

## 2016-10-08 DIAGNOSIS — K219 Gastro-esophageal reflux disease without esophagitis: Secondary | ICD-10-CM | POA: Diagnosis present

## 2016-10-08 HISTORY — PX: PR LAPS GSTR RSTCV PX W/BYP ROUX-EN-Y LIMB <150 CM: 43644

## 2016-10-08 HISTORY — PX: PR LAP GASTRIC BYPASS/ROUX-EN-Y: 43644

## 2016-10-08 LAB — POCT URINE PREGNANCY: Lot #: 7110176

## 2016-10-08 LAB — EKG 12-LEAD
P: 48 deg
PR: 156 ms
QRS: 22 deg
QRSD: 96 ms
QT: 392 ms
QTc: 450 ms
Rate: 79 {beats}/min
Severity: NORMAL
T: 38 deg

## 2016-10-08 LAB — POCT GLUCOSE
Glucose POCT: 80 mg/dL (ref 60–99)
Glucose POCT: 129 mg/dL — ABNORMAL HIGH (ref 60–99)
Glucose POCT: 140 mg/dL — ABNORMAL HIGH (ref 60–99)

## 2016-10-08 SURGERY — CREATION, GASTRIC BYPASS, LAPAROSCOPIC
Anesthesia: General | Site: Abdomen | Wound class: Clean Contaminated

## 2016-10-08 MED ORDER — GLUCOSE 40 % PO GEL *I*
15.0000 g | ORAL | Status: DC | PRN
Start: 2016-10-08 — End: 2016-10-10

## 2016-10-08 MED ORDER — DEXAMETHASONE SODIUM PHOSPHATE 4 MG/ML INJ SOLN *WRAPPED*
INTRAMUSCULAR | Status: DC | PRN
Start: 2016-10-08 — End: 2016-10-08
  Administered 2016-10-08: 4 mg via INTRAVENOUS

## 2016-10-08 MED ORDER — KETOROLAC TROMETHAMINE 30 MG/ML IJ SOLN *I*
INTRAMUSCULAR | Status: DC | PRN
Start: 2016-10-08 — End: 2016-10-08
  Administered 2016-10-08: 30 mg via INTRAVENOUS

## 2016-10-08 MED ORDER — KETOROLAC TROMETHAMINE 30 MG/ML IJ SOLN *I*
15.0000 mg | Freq: Four times a day (QID) | INTRAMUSCULAR | Status: DC
Start: 2016-10-08 — End: 2016-10-10
  Administered 2016-10-08 – 2016-10-10 (×7): 15 mg via INTRAVENOUS
  Filled 2016-10-08 (×7): qty 1

## 2016-10-08 MED ORDER — POTASSIUM CHLORIDE IN NACL 20-0.45 MEQ/L-% IV SOLN *I*
75.0000 mL/h | INTRAVENOUS | Status: AC
Start: 2016-10-08 — End: 2016-10-09
  Administered 2016-10-08: 125 mL/h via INTRAVENOUS
  Administered 2016-10-09 (×2): 75 mL/h via INTRAVENOUS
  Administered 2016-10-09: 125 mL/h via INTRAVENOUS

## 2016-10-08 MED ORDER — GLUCAGON HCL (RDNA) 1 MG IJ SOLR *WRAPPED*
1.0000 mg | INTRAMUSCULAR | Status: DC | PRN
Start: 2016-10-08 — End: 2016-10-10

## 2016-10-08 MED ORDER — KETOROLAC TROMETHAMINE 30 MG/ML IJ SOLN *I*
INTRAMUSCULAR | Status: AC
Start: 2016-10-08 — End: 2016-10-08
  Filled 2016-10-08: qty 1

## 2016-10-08 MED ORDER — HALOPERIDOL LACTATE 5 MG/ML IJ SOLN *I*
0.5000 mg | Freq: Once | INTRAMUSCULAR | Status: DC | PRN
Start: 2016-10-08 — End: 2016-10-08

## 2016-10-08 MED ORDER — ONDANSETRON HCL 2 MG/ML IV SOLN *I*
INTRAMUSCULAR | Status: AC
Start: 2016-10-08 — End: 2016-10-08
  Filled 2016-10-08: qty 2

## 2016-10-08 MED ORDER — AMPICILLIN-SULBACTAM IN NS 3 GM *I*
3000.0000 mg | Freq: Four times a day (QID) | INTRAMUSCULAR | Status: AC
Start: 2016-10-08 — End: 2016-10-09
  Administered 2016-10-08 – 2016-10-09 (×3): 3000 mg via INTRAVENOUS
  Filled 2016-10-08 (×3): qty 100

## 2016-10-08 MED ORDER — HYDROMORPHONE HCL PF 0.5 MG/0.5 ML IJ SOLN *I*
0.5000 mg | INTRAMUSCULAR | Status: DC | PRN
Start: 2016-10-08 — End: 2016-10-08

## 2016-10-08 MED ORDER — PROMETHAZINE HCL 25 MG/ML IJ SOLN *I*
INTRAMUSCULAR | Status: DC | PRN
Start: 2016-10-08 — End: 2016-10-08
  Administered 2016-10-08: 12.5 mg via INTRAVENOUS

## 2016-10-08 MED ORDER — PANTOPRAZOLE SODIUM 40 MG IV SOLR *I*
40.0000 mg | Freq: Every day | INTRAVENOUS | Status: AC
Start: 2016-10-08 — End: 2016-10-08
  Administered 2016-10-08: 40 mg via INTRAVENOUS
  Filled 2016-10-08: qty 10

## 2016-10-08 MED ORDER — FENTANYL CITRATE 50 MCG/ML IJ SOLN *WRAPPED*
INTRAMUSCULAR | Status: AC
Start: 2016-10-08 — End: 2016-10-08
  Filled 2016-10-08: qty 2

## 2016-10-08 MED ORDER — BUPIVACAINE HCL 0.25 % IJ SOLUTION *WRAPPED*
Status: DC | PRN
Start: 2016-10-08 — End: 2016-10-08
  Administered 2016-10-08: 30 mL via SUBCUTANEOUS

## 2016-10-08 MED ORDER — LACTATED RINGERS IV SOLN *I*
125.0000 mL/h | INTRAVENOUS | Status: DC
Start: 2016-10-08 — End: 2016-10-08
  Administered 2016-10-08: 125 mL/h via INTRAVENOUS

## 2016-10-08 MED ORDER — MEPERIDINE HCL 25 MG/ML IJ SOLN *I*
12.5000 mg | INTRAMUSCULAR | Status: DC | PRN
Start: 2016-10-08 — End: 2016-10-08

## 2016-10-08 MED ORDER — PROPOFOL 10 MG/ML IV EMUL (INTERMITTENT DOSING) WRAPPED *I*
INTRAVENOUS | Status: AC
Start: 2016-10-08 — End: 2016-10-08
  Filled 2016-10-08: qty 20

## 2016-10-08 MED ORDER — FENTANYL CITRATE 50 MCG/ML IJ SOLN *WRAPPED*
INTRAMUSCULAR | Status: DC | PRN
Start: 2016-10-08 — End: 2016-10-08
  Administered 2016-10-08: 100 ug via INTRAVENOUS

## 2016-10-08 MED ORDER — LIDOCAINE HCL 2 % IJ SOLN *I*
INTRAMUSCULAR | Status: DC | PRN
Start: 2016-10-08 — End: 2016-10-08
  Administered 2016-10-08: 100 mg via INTRAVENOUS

## 2016-10-08 MED ORDER — PROMETHAZINE HCL 25 MG/ML IJ SOLN *I*
INTRAMUSCULAR | Status: AC
Start: 2016-10-08 — End: 2016-10-08
  Filled 2016-10-08: qty 1

## 2016-10-08 MED ORDER — ONDANSETRON HCL 2 MG/ML IV SOLN *I*
4.0000 mg | INTRAMUSCULAR | Status: AC | PRN
Start: 2016-10-08 — End: 2016-10-09
  Administered 2016-10-08 – 2016-10-09 (×2): 4 mg via INTRAVENOUS
  Filled 2016-10-08 (×2): qty 2

## 2016-10-08 MED ORDER — HEPARIN SODIUM 5000 UNIT/ML SQ *I*
5000.0000 [IU] | Freq: Once | SUBCUTANEOUS | Status: AC
Start: 2016-10-08 — End: 2016-10-08
  Administered 2016-10-08: 5000 [IU] via SUBCUTANEOUS
  Filled 2016-10-08: qty 1

## 2016-10-08 MED ORDER — SODIUM CHLORIDE 0.9 % IV SOLN WRAPPED *I*
20.0000 mL/h | Status: DC
Start: 2016-10-08 — End: 2016-10-08

## 2016-10-08 MED ORDER — BUPIVACAINE HCL 0.25 % IJ SOLUTION *WRAPPED*
Status: AC
Start: 2016-10-08 — End: 2016-10-08
  Filled 2016-10-08: qty 30

## 2016-10-08 MED ORDER — HALOPERIDOL LACTATE 5 MG/ML IJ SOLN *I*
INTRAMUSCULAR | Status: AC
Start: 2016-10-08 — End: 2016-10-08
  Filled 2016-10-08: qty 1

## 2016-10-08 MED ORDER — ONDANSETRON HCL 2 MG/ML IV SOLN *I*
INTRAMUSCULAR | Status: DC | PRN
Start: 2016-10-08 — End: 2016-10-08
  Administered 2016-10-08: 4 mg via INTRAMUSCULAR

## 2016-10-08 MED ORDER — LACTATED RINGERS IV SOLN *I*
20.0000 mL/h | INTRAVENOUS | Status: DC
Start: 2016-10-08 — End: 2016-10-08
  Administered 2016-10-08: 20 mL/h via INTRAVENOUS

## 2016-10-08 MED ORDER — DEXTROSE 50 % IV SOLN *I*
25.0000 g | INTRAVENOUS | Status: DC | PRN
Start: 2016-10-08 — End: 2016-10-10

## 2016-10-08 MED ORDER — PROPOFOL 10 MG/ML IV EMUL (INTERMITTENT DOSING) WRAPPED *I*
INTRAVENOUS | Status: DC | PRN
Start: 2016-10-08 — End: 2016-10-08
  Administered 2016-10-08: 150 mg via INTRAVENOUS
  Administered 2016-10-08: 50 mg via INTRAVENOUS

## 2016-10-08 MED ORDER — INSULIN REGULAR HUMAN 100 UNIT/ML IJ SOLN *I*
0.0000 [IU] | Freq: Four times a day (QID) | INTRAMUSCULAR | Status: AC
Start: 2016-10-08 — End: 2016-10-09

## 2016-10-08 MED ORDER — ROCURONIUM BROMIDE 10 MG/ML IV SOLN *WRAPPED*
Status: DC | PRN
Start: 2016-10-08 — End: 2016-10-08
  Administered 2016-10-08: 70 mg via INTRAVENOUS

## 2016-10-08 MED ORDER — HYDROMORPHONE HCL PF 0.5 MG/0.5 ML IJ SOLN *I*
0.5000 mg | INTRAMUSCULAR | Status: AC | PRN
Start: 2016-10-08 — End: 2016-10-09
  Administered 2016-10-08 – 2016-10-09 (×4): 0.5 mg via INTRAVENOUS
  Filled 2016-10-08 (×4): qty 0.5

## 2016-10-08 MED ORDER — NEOSTIGMINE METHYLSULFATE 10 MG/10ML IV SOLN *I*
INTRAVENOUS | Status: DC | PRN
Start: 2016-10-08 — End: 2016-10-08
  Administered 2016-10-08: 6 mg via INTRAVENOUS

## 2016-10-08 MED ORDER — ROCURONIUM BROMIDE 10 MG/ML IV SOLN *WRAPPED*
Status: AC
Start: 2016-10-08 — End: 2016-10-08
  Filled 2016-10-08: qty 10

## 2016-10-08 MED ORDER — NEOSTIGMINE METHYLSULFATE 10 MG/10ML IV SOLN *I*
INTRAVENOUS | Status: AC
Start: 2016-10-08 — End: 2016-10-08
  Filled 2016-10-08: qty 6

## 2016-10-08 MED ORDER — DEXAMETHASONE SODIUM PHOSPHATE 4 MG/ML INJ SOLN *WRAPPED*
INTRAMUSCULAR | Status: AC
Start: 2016-10-08 — End: 2016-10-08
  Filled 2016-10-08: qty 1

## 2016-10-08 MED ORDER — HALOPERIDOL LACTATE 5 MG/ML IJ SOLN *I*
INTRAMUSCULAR | Status: DC | PRN
Start: 2016-10-08 — End: 2016-10-08
  Administered 2016-10-08: 1 mg via INTRAVENOUS

## 2016-10-08 MED ORDER — LIDOCAINE HCL 1 % IJ SOLN *I*
0.1000 mL | INTRAMUSCULAR | Status: DC | PRN
Start: 2016-10-08 — End: 2016-10-08
  Administered 2016-10-08: 0.1 mL via SUBCUTANEOUS
  Filled 2016-10-08: qty 2

## 2016-10-08 MED ORDER — HYDROMORPHONE HCL PF 0.5 MG/0.5 ML IJ SOLN *I*
INTRAMUSCULAR | Status: AC
Start: 2016-10-08 — End: 2016-10-08
  Filled 2016-10-08: qty 0.5

## 2016-10-08 MED ORDER — ENOXAPARIN SODIUM 40 MG/0.4ML IJ SOSY *I*
40.0000 mg | PREFILLED_SYRINGE | Freq: Two times a day (BID) | INTRAMUSCULAR | Status: DC
Start: 2016-10-08 — End: 2016-10-10
  Administered 2016-10-08 – 2016-10-10 (×4): 40 mg via SUBCUTANEOUS
  Filled 2016-10-08 (×4): qty 0.4

## 2016-10-08 MED ORDER — LIDOCAINE HCL 2 % (PF) IJ SOLN *I*
INTRAMUSCULAR | Status: AC
Start: 2016-10-08 — End: 2016-10-08
  Filled 2016-10-08: qty 5

## 2016-10-08 MED ORDER — HYDROMORPHONE HCL PF 1 MG/ML IJ SOLN *WRAPPED*
INTRAMUSCULAR | Status: DC | PRN
Start: 2016-10-08 — End: 2016-10-08
  Administered 2016-10-08: 0.5 mg via INTRAVENOUS

## 2016-10-08 MED ORDER — GLYCOPYRROLATE 0.2 MG/ML IJ SOLN *WRAPPED*
INTRAMUSCULAR | Status: DC | PRN
Start: 2016-10-08 — End: 2016-10-08
  Administered 2016-10-08: 0.8 mg via INTRAVENOUS

## 2016-10-08 MED ORDER — PROMETHAZINE HCL 25 MG/ML IJ SOLN *I*
6.2500 mg | Freq: Once | INTRAMUSCULAR | Status: DC | PRN
Start: 2016-10-08 — End: 2016-10-08

## 2016-10-08 MED ORDER — AMPICILLIN-SULBACTAM IN NS 3 GM *I*
3000.0000 mg | Freq: Once | INTRAMUSCULAR | Status: AC
Start: 2016-10-08 — End: 2016-10-08
  Administered 2016-10-08: 3 g via INTRAVENOUS
  Filled 2016-10-08: qty 100

## 2016-10-08 MED ORDER — HYDROMORPHONE HCL PF 1 MG/ML IJ SOLN *WRAPPED*
1.0000 mg | INTRAMUSCULAR | Status: AC | PRN
Start: 2016-10-08 — End: 2016-10-09
  Administered 2016-10-08: 1 mg via INTRAVENOUS
  Filled 2016-10-08: qty 1

## 2016-10-08 MED ORDER — MIDAZOLAM HCL 1 MG/ML IJ SOLN *I* WRAPPED
INTRAMUSCULAR | Status: DC | PRN
Start: 2016-10-08 — End: 2016-10-08
  Administered 2016-10-08: 2 mg via INTRAVENOUS

## 2016-10-08 MED ORDER — ALBUTEROL SULFATE (2.5 MG/3ML) 0.083% IN NEBU *I*
2.5000 mg | INHALATION_SOLUTION | Freq: Once | RESPIRATORY_TRACT | Status: DC | PRN
Start: 2016-10-08 — End: 2016-10-08

## 2016-10-08 MED ORDER — MIDAZOLAM HCL 1 MG/ML IJ SOLN *I* WRAPPED
INTRAMUSCULAR | Status: AC
Start: 2016-10-08 — End: 2016-10-08
  Filled 2016-10-08: qty 2

## 2016-10-08 MED ORDER — GLYCOPYRROLATE 0.2 MG/ML IJ SOLN *WRAPPED*
INTRAMUSCULAR | Status: AC
Start: 2016-10-08 — End: 2016-10-08
  Filled 2016-10-08: qty 4

## 2016-10-08 MED ORDER — PROMETHAZINE HCL 25 MG/ML IJ SOLN *I*
25.0000 mg | Freq: Four times a day (QID) | INTRAMUSCULAR | Status: AC | PRN
Start: 2016-10-08 — End: 2016-10-09
  Administered 2016-10-09: 25 mg via INTRAVENOUS
  Filled 2016-10-08: qty 1

## 2016-10-08 SURGICAL SUPPLY — 43 items
ADHESIVE DERMABOND GLUE HI VISCOSITY (Dressing) ×1
ADHESIVE SKIN CLOSURE 0.7ML DERMABOND ADVANCED (Dressing) ×2 IMPLANT
APPLIER CLIP MULTIPLE 20MED LG (Supply) IMPLANT
BANDAGE ELASTIC MATRIX 6X5YD (Other) ×6 IMPLANT
BLADE CLIPPER SURG (Supply)
BLADE SUR CLIPPER W37.2MM CUT H0.23MM GEN PURP EXISTING CLP HNDL DISP (Supply) IMPLANT
BLADE SUR W37.2MM CUT H0.23MM GEN PURP EXISTING CLP HNDL DISP (Supply) IMPLANT
BLANKET UPPER BODY TEMP THERAPY (Drape) IMPLANT
BULB RESERVOIR SUCT 100CC SILICONE (Supply) IMPLANT
COVER FOOT IMPAD LG NONSTER (Supply) IMPLANT
COVER FOOT IMPAD REGULAR NL (Supply) IMPLANT
COVER LIGHT HANDLE SURG FLEX (Other) ×3 IMPLANT
DRAIN HEMADUCT 19F (Supply)
DRAIN WND 19FR 3/4 CHN RND SIL (Supply) IMPLANT
ENDO CLOSE USSC-TROCAR SITE CL (Supply) IMPLANT
GLOVE SURG PROTEXIS PI 7.5 PF SYN (Glove) ×13 IMPLANT
HANDPIECE S/I 5MM X 32CM CANNULA DISP (Supply) ×3 IMPLANT
KIT GASTRIC LAVAGE EDLICH 34FR (Supply) ×3 IMPLANT
KIT VUETIP TROCAR SWAB ACCESSORY (Supply) ×3 IMPLANT
PACK CUSTOM LAPAROSCOPIC GASTRIC BYPASS (Pack) ×3 IMPLANT
ROD OST L4IN PLAS LOOP EYELET BOTH END DISP (Supply) ×1 IMPLANT
ROD OSTOMY LOOP 4IN (Supply) ×2
SHEARS HARMONIC HD 1000I 36CM SHAFT (Other) ×3 IMPLANT
SLEEVE XCEL 12MMX100MM (Other) ×9 IMPLANT
SOL H2O IRRIG STER 1000ML BTL (Solution) ×1
SOL SOD CHL IV .9PCT 1000ML BAG (Drug) ×3 IMPLANT
SOL WATER IRRIG STERILE 1000ML BTL (Solution) ×2 IMPLANT
STAPLE ENDO GIA 30-3.5MM DLU (Supply) IMPLANT
STAPLER MULTIFR ENDO GIA30 3.5 (Supply) ×3 IMPLANT
STAPLER POWERED ECHELON FLEX 45MM (Other) ×3 IMPLANT
STAPLER RELOAD 45 GST BLUE (Other) ×9 IMPLANT
STAPLER RELOAD 45 GST WHITE (Other) ×9 IMPLANT
SUTR ETHIBOND 2-0 V-7 X964H GREEN (Suture) ×21 IMPLANT
SUTR MONOCRYL PLUS 4-0 18PS2 (Suture) ×6 IMPLANT
SUTR PROLENE MONO 2-0 KS BLUE (Suture) ×3 IMPLANT
SUTR SILK 2-0 FS 18 IN BLACK (Suture) IMPLANT
SUTR VICRYL ANITIB 3-0 SH 27 VIOLET (Suture) ×40 IMPLANT
SYRINGE LUERLOCK 30ML INDIVIDUAL WRAP (Supply) ×2 IMPLANT
SYRINGE ONLY 30ML LUER-LOK (Supply) ×1
TROCAR XCEL ENDOPATH BLADELESS 12X100MM (Other) ×3 IMPLANT
TROCAR XCEL ENDOPATH BLADELESS 12X150MM (Other) IMPLANT
TUBE SALEM SUMP 18FR W/VALVE NL (Supply) ×3 IMPLANT
TUBING INSUFFLATON F UHI (Tubing) ×3 IMPLANT

## 2016-10-08 NOTE — Progress Notes (Signed)
[facility] to [facility], [facility]  For further questions call pacu [phone].

## 2016-10-08 NOTE — Anesthesia Case Conclusion (Signed)
CASE CONCLUSION  Emergence  Actions:  Suctioned, OPA and extubated  Criteria Used for Airway Removal:  Adequate Tv & RR and acceptable O2 saturation  Assessment:  Routine  Transport  Directly to: PACU  Airway:  OPA and facemask  Oxygen Delivery:  10 lpm  Position:  Recumbent  Patient Condition on Handoff  Level of Consciousness:  Deeply sedated/GA  Patient Condition:  Stable  Handoff Report to:  RN

## 2016-10-08 NOTE — Progress Notes (Signed)
UPDATES TO PATIENT'S CONDITION on the DAY OF SURGERY/PROCEDURE    I. Updates to Patient's Condition (to be completed by a provider privileged to complete a H&P, following reassessment of the patient by the provider):    Day of Surgery/Procedure Update:  History  History reviewed and no change    Physical  Physical exam updated and no change            II. Procedure Readiness   I have reviewed the patient's H&P and updated condition. By completing and signing this form, I attest that this patient is ready for surgery/procedure.    III. Attestation   I have reviewed the updated information regarding the patient's condition and it is appropriate to proceed with the planned surgery/procedure.    Darrell JewelWILLIAM E Sahib Pella, MD as of 11:16 AM 10/08/2016

## 2016-10-08 NOTE — INTERIM OP NOTE (Signed)
Interim Op Note (Surgical Log ID: [id number])       Date of Surgery: 10/08/2016       Surgeons: Surgeon(s) and Role:     * Darrell Jewelmalley, William E, MD - Primary     * Bartolo DarterSimon, Durenda Pechacek, MD - Resident - Assisting       Pre-op Diagnosis: Pre-Op Diagnosis Codes:     * Morbid obesity [E66.01]       Post-op Diagnosis: Post-Op Diagnosis Codes:     * Morbid obesity [E66.01]       Procedure(s) Performed: Procedure:    LAPAROSCOPIC GASTRIC BYPASS  CPT(R) Code:  7846943644 - PR LAP GASTRIC BYPASS/ROUX-EN-Y         Additional CPT Codes:        Anesthesia Type: General        Fluid Totals: I/O this shift:  08/01 0700 - 08/01 1459  In: 700 (6.8 mL/kg) [I.V.:700]  Out: 40 (0.4 mL/kg) [Blood:40]  Net: 660  Weight: 103.1 kg        Estimated Blood Loss: Blood Loss: 40 mL       Specimens to Pathology:  * No specimens in log *       Temporary Implants:        Packing:                 Patient Condition: good       Findings (Including unexpected complications): -     Signed:  Bartolo DarterBartholomew Lily Velasquez, MD  on 10/08/2016 at 2:14 PM

## 2016-10-08 NOTE — Anesthesia Procedure Notes (Signed)
---------------------------------------------------------------------------------------------------------------------------------------    AIRWAY   GENERAL INFORMATION AND STAFF    Patient location during procedure: OR       Date of Procedure: 10/08/2016 12:35 PM  CONDITION PRIOR TO MANIPULATION     Current Airway/Neck Condition:  Normal        For more airway physical exam details, see Anesthesia PreOp Evaluation  AIRWAY METHOD     Patient Position:  Sniffing    Preoxygenated: yes      Induction: IV  Mask Difficulty Assessment:  0 - not attempted      Technique Used for Successful ETT Placement:  Direct laryngoscopy    Blade Type:  Miller    Laryngoscope Blade/Video laryngoscope Blade Size:  2    Cormack-Lehane Classification:  Grade I - full view of glottis    Placement Verified by: capnometry      Number of Attempts at Approach:  1  FINAL AIRWAY DETAILS    Final Airway Type:  Endotracheal airway    Final Endotracheal Airway:  ETT      Cuffed: cuffed    Insertion Site:  Oral    ETT Size (mm):  7.0  ----------------------------------------------------------------------------------------------------------------------------------------

## 2016-10-08 NOTE — Progress Notes (Addendum)
Bariatric Surgery Post-op    Interval history   Pain is controlled. Denies cp, sob, f/c/r, n/v. Offers no complaints.    Medications   Scheduled Meds:   enoxaparin  40 mg Subcutaneous Q12H    pantoprazole  40 mg Intravenous Daily with dinner    ketorolac  15 mg Intravenous Q6H    ampicillin-sulbactam IV  3,000 mg Intravenous Q6H    insulin regular  0-25 Units Subcutaneous 4 times per day     Continuous Infusions:   1/2 NS + 20 meq KCL 125 mL/hr (10/08/16 1518)     PRN Meds:.ondansetron, promethazine, Nursing communication- Give 4 OZ of fruit juice for BG < 70 mg/dl **AND** dextrose **AND** dextrose **AND** glucagon **AND** POCT glucose, HYDROmorphone PF **OR** HYDROmorphone PF    Physical Exam   BP: (125-165)/(70-106)   Temp:  [36.3 C (97.3 F)-36.8 C (98.2 F)]   Temp src: Temporal (08/01 1603)  Heart Rate:  [79-99]   Resp:  [16-28]   SpO2:  [92 %-98 %]   Weight:  [103.1 kg (227 lb 6.4 oz)]     General: lying in bed in NAD  Neuro: Alert and oriented, answers questions appropriately   Heart: RRR, s1s2, no mrg  Lungs: clear in anterior fields with good air movement  Abd: soft, not tender or distended. Incisions clean and dry  Extremities: no edema    Pertinent Labs, Imaging   -    Assessment & Plan   41 y.o. female with a hx of pre-DM2, OSA, SVT, and morbid obesity who is POD#0 s/p laparoscopic RYGB. Recovering as expected in the early post-operative period.    - F/E/N: NPO, IVF  - Analgesia: per protocol, prn  - CV: HDS  - Pulm: Pulmonary toilet, CPAP nightly  - Anticoagulation: lovenox 40mg  bid  - PT/OT/Activity: ambulate tonight    Bartolo DarterBartholomew Yaire Kreher, MD  10/08/2016 at 4:44 PM

## 2016-10-08 NOTE — Plan of Care (Signed)
Bladder Elimination Post-Surgical (bariatric)    • Patient is able to empty bladder or return to baseline (bari Progressing towards goal        Bowel Elimination post surgical (bariatric)    • Elimination pattern is normal or improving (bariatric) Progressing towards goal        Hemodynamic Stability (Bariatric)    • Maintain Hemodynamic Stability (Bariatric) Progressing towards goal        Impaired Balance (Bariatric)    • Patient will maintain balance to allow for safe mobility and Progressing towards goal        Mobility (Bariatric)    • Patient's mobility and activity restrictions are maintained Progressing towards goal    • Patient's mobility is maintained or improved Progressing towards goal        Nutrition (Bariatric)    • Patient's nutritional status is maintained or improved (Bari Progressing towards goal        Post-Operative Complications (bariatric)    • Prevent post-operative complications (bariatric) Progressing towards goal        Potential for impaired Circulation, Motor, or Sensation    • Patient's CMS status is maintained or improved from baseline Progressing towards goal        Psychosocial (bariatric)    • Demonstrates ability to cope with surgery Progressing towards goal

## 2016-10-09 ENCOUNTER — Encounter: Payer: Self-pay | Admitting: Surgery

## 2016-10-09 LAB — POCT GLUCOSE
Glucose POCT: 132 mg/dL — ABNORMAL HIGH (ref 60–99)
Glucose POCT: 81 mg/dL (ref 60–99)
Glucose POCT: 86 mg/dL (ref 60–99)
Glucose POCT: 98 mg/dL (ref 60–99)

## 2016-10-09 LAB — BASIC METABOLIC PANEL
Anion Gap: 15 (ref 7–16)
CO2: 22 mmol/L (ref 20–28)
Calcium: 8.1 mg/dL — ABNORMAL LOW (ref 8.8–10.2)
Chloride: 101 mmol/L (ref 96–108)
Creatinine: 0.67 mg/dL (ref 0.51–0.95)
GFR,Black: 128 *
GFR,Caucasian: 111 *
Glucose: 100 mg/dL — ABNORMAL HIGH (ref 60–99)
Lab: 7 mg/dL (ref 6–20)
Potassium: 4.1 mmol/L (ref 3.3–5.1)
Sodium: 138 mmol/L (ref 133–145)

## 2016-10-09 LAB — MCHC: MCHC: 34 g/dL (ref 32–36)

## 2016-10-09 LAB — HEMATOCRIT: Hematocrit: 36 % (ref 34–45)

## 2016-10-09 MED ORDER — ESCITALOPRAM OXALATE 10 MG PO TABS *I*
10.0000 mg | ORAL_TABLET | Freq: Every day | ORAL | Status: DC
Start: 2016-10-09 — End: 2016-10-10
  Administered 2016-10-09: 10 mg via ORAL
  Filled 2016-10-09 (×2): qty 1

## 2016-10-09 MED ORDER — HYDROCODONE-ACETAMINOPHEN 5-325 MG PO TABS *I*
2.0000 | ORAL_TABLET | Freq: Four times a day (QID) | ORAL | Status: DC | PRN
Start: 2016-10-09 — End: 2016-10-10
  Administered 2016-10-09 – 2016-10-10 (×3): 2 via ORAL
  Filled 2016-10-09 (×2): qty 2

## 2016-10-09 MED ORDER — HYDROCODONE-ACETAMINOPHEN 5-325 MG PO TABS *I*
1.0000 | ORAL_TABLET | Freq: Four times a day (QID) | ORAL | Status: DC | PRN
Start: 2016-10-09 — End: 2016-10-10
  Filled 2016-10-09 (×2): qty 1

## 2016-10-09 MED ORDER — INSULIN LISPRO (HUMAN) 100 UNIT/ML IJ/SC SOLN *WRAPPED*
0.0000 [IU] | Freq: Three times a day (TID) | SUBCUTANEOUS | Status: DC
Start: 2016-10-09 — End: 2016-10-10

## 2016-10-09 MED ORDER — DOCUSATE SODIUM 100 MG PO CAPS *I*
100.0000 mg | ORAL_CAPSULE | Freq: Two times a day (BID) | ORAL | Status: DC
Start: 2016-10-09 — End: 2016-10-10
  Administered 2016-10-09 – 2016-10-10 (×2): 100 mg via ORAL
  Filled 2016-10-09 (×2): qty 1

## 2016-10-09 MED ORDER — PANTOPRAZOLE SODIUM 40 MG PO TBEC *I*
40.0000 mg | DELAYED_RELEASE_TABLET | Freq: Every evening | ORAL | Status: DC
Start: 2016-10-09 — End: 2016-10-10
  Administered 2016-10-09: 40 mg via ORAL
  Filled 2016-10-09: qty 1

## 2016-10-09 NOTE — Progress Notes (Signed)
Writer met with pt to review gastric bypass liquid diet. Pt was receptive and verbalized understanding of the diet .    Brooke Hanson , RD, CDN, Pager [phone]#

## 2016-10-09 NOTE — Anesthesia Postprocedure Evaluation (Signed)
Anesthesia Post-Op Note    Patient: Brooke Hanson    Procedure(s) Performed:  Procedure Summary  Date:  10/08/2016 Anesthesia Start: 10/08/2016 12:08 PM Anesthesia Stop: 10/08/2016  2:12 PM Room / Location:  H_OR_04 / [facility] MAIN OR   Procedure(s):  LAPAROSCOPIC GASTRIC BYPASS Diagnosis:  morbid obesity Surgeon(s):  Darrell Jewelmalley, William E, MD  Bartolo DarterSimon, Bartholomew, MD Attending Anesthesiologist:  Harley AltoBECKS, Cylee Dattilo, MD         Recovery Vitals  BP: 130/72 (10/09/2016  9:57 AM)  Heart Rate: 86 (10/09/2016  9:57 AM)  Heart Rate (via Pulse Ox): 88 (10/08/2016  3:30 PM)  Resp: 18 (10/09/2016  9:57 AM)  Temp: 36.2 C (97.2 F) (10/09/2016  9:57 AM)  SpO2: 96 % (10/09/2016  9:57 AM)  O2 Flow Rate: 2 L/min (10/09/2016  7:17 AM)   0-10 Scale: 2 (10/09/2016  7:58 AM)  Anesthesia type:  General  Complications Noted During Procedure or in PACU:  None   Comment:    Patient Location:  [facility] Floor  Level of Consciousness:    Recovered to baseline, awake, alert and oriented  Patient Participation:     Able to participate  Temperature Status:    Normothermic  Oxygen Saturation:    Within patient's normal range  Cardiac Status:   Within patient's normal range  Fluid Status:    Stable  Airway Patency:     Yes  Pulmonary Status:    Baseline  Pain Management:    Adequate analgesia  Nausea and Vomiting:  None    Post Op Assessment:    Tolerated procedure well and no evidence of recall   Attending Attestation:  All indicated post anesthesia care provided     -

## 2016-10-09 NOTE — Progress Notes (Signed)
10/09/16 0900   [facility] Patient Class Review   Patient Class Review Inpatient

## 2016-10-09 NOTE — Progress Notes (Addendum)
Bariatric Surgery Progress Note    Interval history   No acute events  Feels well this morning, offers no complaints    Medications   Scheduled Meds:   enoxaparin  40 mg Subcutaneous Q12H    ketorolac  15 mg Intravenous Q6H    ampicillin-sulbactam IV  3,000 mg Intravenous Q6H    insulin regular  0-25 Units Subcutaneous 4 times per day     Continuous Infusions:   1/2 NS + 20 meq KCL 125 mL/hr (10/09/16 0053)     PRN Meds:.ondansetron, promethazine, Nursing communication- Give 4 OZ of fruit juice for BG < 70 mg/dl **AND** dextrose **AND** dextrose **AND** glucagon **AND** POCT glucose, HYDROmorphone PF **OR** HYDROmorphone PF    Physical Exam   BP: (125-171)/(66-106)   Temp:  [35.9 C (96.6 F)-37.3 C (99.1 F)]   Temp src: Temporal (08/02 0153)  Heart Rate:  [79-102]   Resp:  [16-28]   SpO2:  [92 %-98 %]   Weight:  [103.1 kg (227 lb 6.4 oz)]     General: lying in bed in NAD  Neuro: Alert and oriented, answers questions appropriately   Heart: RRR, s1s2, no mrg. Sinus on monitor  Lungs: clear in anterior fields with good air movement  Abd: soft, not tender or distended. Incisions clean and dry  Extremities: no edema    Labs   Chemistry:  Recent Labs  Lab 10/07/16  0859   Sodium 140   Potassium 4.1   Chloride 103   CO2 26   UN 12   Creatinine 0.79   Glucose 102*   Calcium 8.6*     Heme/coag:  Recent Labs  Lab 10/07/16  0859   Hematocrit 37       Imaging   No results found.    Assessment & Plan   1 Day Post-Op  Campbell RichesMichelle M Lanphere is a 41 y.o. female with a hx of OSA, pre-DM2, SVT, and morbid obesity who is s/p laparoscopic RYGB 8/1. Recovering as expected on the floor.    1. Advance per protocol this morning  2. Home medications tonight  3. Anticipate discharge home tomorrow    Bartolo DarterBartholomew Simon, MD  10/09/2016 at 6:29 AM        I saw and evaluated the patient. I agree with the resident's/fellow's findings and plan of care as documented above.    Darrell JewelWILLIAM E Keiara Sneeringer, MD

## 2016-10-10 LAB — POCT GLUCOSE
Glucose POCT: 87 mg/dL (ref 60–99)
Glucose POCT: 84 mg/dL (ref 60–99)
Glucose POCT: 78 mg/dL (ref 60–99)

## 2016-10-10 MED ORDER — CHILDRENS CHEWABLE VITAMINS PO CHEW
1.0000 | CHEWABLE_TABLET | Freq: Two times a day (BID) | ORAL | 0 refills | Status: DC
Start: 2016-10-10 — End: 2016-10-10

## 2016-10-10 MED ORDER — HYDROCODONE-ACETAMINOPHEN 5-325 MG PO TABS *I*
1.0000 | ORAL_TABLET | Freq: Four times a day (QID) | ORAL | 0 refills | Status: DC | PRN
Start: 2016-10-10 — End: 2016-10-10

## 2016-10-10 MED ORDER — POLYETHYLENE GLYCOL 3350 PO PACK 17 GM *I*
17.0000 g | PACK | Freq: Two times a day (BID) | ORAL | 0 refills | Status: DC
Start: 2016-10-10 — End: 2016-10-10

## 2016-10-10 MED ORDER — DOCUSATE SODIUM 100 MG PO CAPS *I*
100.0000 mg | ORAL_CAPSULE | Freq: Two times a day (BID) | ORAL | 0 refills | Status: DC
Start: 2016-10-10 — End: 2016-10-10

## 2016-10-10 MED ORDER — HYDROCODONE-ACETAMINOPHEN 5-325 MG PO TABS *I*
1.0000 | ORAL_TABLET | Freq: Four times a day (QID) | ORAL | 0 refills | Status: DC | PRN
Start: 2016-10-10 — End: 2017-03-06

## 2016-10-10 MED ORDER — POLYETHYLENE GLYCOL 3350 PO PACK 17 GM *I*
17.0000 g | PACK | Freq: Two times a day (BID) | ORAL | 0 refills | Status: AC
Start: 2016-10-10 — End: 2016-10-17

## 2016-10-10 MED ORDER — PROMETHAZINE HCL 25 MG PO TABS *I*
25.0000 mg | ORAL_TABLET | Freq: Four times a day (QID) | ORAL | 0 refills | Status: DC | PRN
Start: 2016-10-10 — End: 2016-10-10

## 2016-10-10 MED ORDER — PROMETHAZINE HCL 25 MG PO TABS *I*
25.0000 mg | ORAL_TABLET | Freq: Four times a day (QID) | ORAL | 0 refills | Status: DC | PRN
Start: 2016-10-10 — End: 2017-01-06

## 2016-10-10 MED ORDER — CHILDRENS CHEWABLE VITAMINS PO CHEW
1.0000 | CHEWABLE_TABLET | Freq: Two times a day (BID) | ORAL | 0 refills | Status: DC
Start: 2016-10-10 — End: 2017-03-06

## 2016-10-10 MED ORDER — DOCUSATE SODIUM 100 MG PO CAPS *I*
100.0000 mg | ORAL_CAPSULE | Freq: Two times a day (BID) | ORAL | 0 refills | Status: DC
Start: 2016-10-10 — End: 2018-03-16

## 2016-10-10 NOTE — Progress Notes (Signed)
Division II Surgery Daily Progress Note    Patient: Brooke Hanson    LOS: 2 days    Attending: Jake Bathe, MD     INTERVAL HISTORY & SUBJECTIVE  - no acute events overnight   - feels well this morning, pain well controlled   - denies CP, SOB, nausea, or vomiting   - tolerated clears and protein shake, passing flatus, no BM     OBJECTIVE  Physical Exam:  Temp:  [35.6 C (96.1 F)-36.9 C (98.4 F)] 36.9 C (98.4 F)  Heart Rate:  [79-95] 85  Resp:  [16-24] 18  BP: (118-168)/(48-85) 118/63   GEN: no apparent distress  HEENT: NCAT, face symmetric  CHEST: nonlabored respirations  ABD: obese, soft, nondistended, minimally nontender, trocar sites cdi.  NEURO/MOTOR: alert, appropriate  EXTREMITIES: atraumatic, SCDs on and functioning  VASCULAR: feet wwp, no edema    Intake/Output:  08/02 0700 - 08/03 0659  In: 811 [P.O.:240; I.V.:645]  Out: 1650 [Urine:1650]     Medications:  Scheduled Meds:   pantoprazole  40 mg Oral Nightly    docusate sodium  100 mg Oral BID    escitalopram  10 mg Oral Daily    insulin lispro  0-20 Units Subcutaneous TID WC    enoxaparin  40 mg Subcutaneous Q12H    ketorolac  15 mg Intravenous Q6H     Continuous Infusions:  PRN Meds:.HYDROcodone-acetaminophen **OR** HYDROcodone-acetaminophen, Nursing communication- Give 4 OZ of fruit juice for BG < 70 mg/dl **AND** dextrose **AND** dextrose **AND** glucagon **AND** POCT glucose     Laboratory values:   Recent Labs      10/09/16   0608  10/07/16   0859   Hematocrit  36  37     No components found with this basename: APTT, PT Recent Labs      10/09/16   0608  10/07/16   0859   Sodium  138  140   Potassium  4.1  4.1   Chloride  101  103   CO2  22  26   UN  7  12   Creatinine  0.67  0.79   Glucose  100*  102*   Calcium  8.1*  8.6*    Recent Labs      10/07/16   0859   AST  20   ALT  23   Alk Phos  68   Bilirubin,Total  0.3     Recent Labs      10/07/16   0859   Total Protein  7.0   Albumin  3.9     No results for input(s): AMY, LIP in the  last 72 hours.   GLUCOSE:   Recent Labs      10/10/16   0315  10/09/16   2124  10/09/16   1722  10/09/16   1251  10/09/16   0655  10/08/16   2358  10/08/16   1850  10/08/16   1412  10/08/16   1101   Glucose POCT  84  87  86  81  98  132*  140*  129*  80     Imaging: No results found.     ASSESSMENT  Brooke Hanson is a 41 y.o. female with h/o OSA, pre-DM, SVT, and morbid obesity who is POD1 from LRYGB. Doing well.    PLAN   Advance to bariatric liquids   Analgesia: toradol, PRN norco    OOB/ambulate   DVT ppx: Lovenox  Dispo: home with bariatric liquids    Author: Santiago Bumpers, MD as of: 10/10/2016  at: 5:26 AM

## 2016-10-10 NOTE — Progress Notes (Signed)
Glucometers not docking properly: 0300 BG 84.    Benjamine Spraguealeb Sonny Anthes, RN

## 2016-10-10 NOTE — Progress Notes (Signed)
Pt's VSS, pain controlled, PIV removed, tolerated shakes. Discharge instructions given to pt who verbalized an understanding. Pt ambulated down to lobby with ride.    Kathaleen BuryMary Wolak, RN

## 2016-10-10 NOTE — Discharge Summary (Signed)
Name: Brooke RichesMichelle M Hanson MRN: [id number] DOB: 10/01/1975     Admit Date: 10/08/2016   Date of Discharge: 10/10/2016    Patient was accepted for discharge to   Home or Self Care [1]           Discharge Attending Physician: Darrell JewelMALLEY, WILLIAM E      Hospitalization Summary    CONCISE NARRATIVE:   Brooke RichesMichelle M Hanson is a 41 y.o. female who came to [facility] for a laparoscopic gastric bypass on 10/08/16. The procedure was completed as planned and she  was transferred to the floor for postoperative management. The postoperative course was uneventful and she was advanced according to bariatric protocol to a gastric bypass 1 diet. She was discharged on POD2 in a stable condition, tolerating a gastric bypass 1 diet and pain controlled.     Brooke RichesMichelle M Hanson has instructions to follow up with the Bariatric Clinic in 1 week.              Signed: Janina MayoBoru Sarrah Fiorenza, MD  On: 10/10/2016  at: 9:12 AM

## 2016-10-10 NOTE — Discharge Instructions (Addendum)
Brief Summary of Your Hospital Stay (including key procedures and diagnostic test results):   You underwent a laparoscopic gastric bypass. Your post-operative course was uneventful and you were discharged home.  Please pay close attention to the medications listed below and DO NOT take any medications that are not listed.  If you are a diabetic, please check your blood sugars as instructed.  Call if they are < 80 or > 180.  Your pain medication also contains acetaminophen.  Do not take any acetaminophen containing products (i.e. Tylenol) while taking your prescriptive pain medication.      Follow up: One week with Bariatric Surgery Clinic: Phone # [phone]    Diet: Gastric Bypass Stage I diet. If you misplace your handout or would like to review the post-operative diets at any time online, you may visit our website:    [email or web address]    Wound Care: You may take the band aids off when you get home.  You may shower.  No tub baths, swimming, or hot tubs.    Notify physician if you notice these problems: Phone # [phone]    Fever greater than 101 degrees Fahrenheit  Chills  Persistent nausea or vomiting not controlled with oral anti-emetic  Increasing abdominal pain  Redness, drainage, or opening of incision  Chest pain  Shortness of breath  Swelling or pain of extremities  Dizziness upon standing    Please take the Colace and Miralax as prescribed below.  If no result, please add Milk of Magnesia (over the counter), 30 cc, at bedtime daily until result.      Activity:   No lifting greater than 10 lbs.   You may walk and it is encouraged as much as possible.   No strenuous exercise until follow up.   Do not drive while taking narcotic pain medication.

## 2016-10-16 ENCOUNTER — Ambulatory Visit: Payer: No Typology Code available for payment source | Attending: Surgery | Admitting: Surgery

## 2016-10-16 ENCOUNTER — Other Ambulatory Visit: Payer: Self-pay | Admitting: Surgery

## 2016-10-16 ENCOUNTER — Encounter: Payer: Self-pay | Admitting: Surgery

## 2016-10-16 ENCOUNTER — Encounter: Payer: No Typology Code available for payment source | Admitting: Surgery

## 2016-10-16 DIAGNOSIS — Z9884 Bariatric surgery status: Secondary | ICD-10-CM | POA: Insufficient documentation

## 2016-10-16 DIAGNOSIS — Z6841 Body Mass Index (BMI) 40.0 and over, adult: Secondary | ICD-10-CM | POA: Insufficient documentation

## 2016-10-16 DIAGNOSIS — K909 Intestinal malabsorption, unspecified: Secondary | ICD-10-CM | POA: Insufficient documentation

## 2016-10-16 LAB — PTH, INTACT: Intact PTH: 33.5 pg/mL (ref 15.0–65.0)

## 2016-10-16 LAB — CBC
Hematocrit: 37 % (ref 34–45)
Hemoglobin: 12.4 g/dL (ref 11.2–15.7)
MCH: 31 pg (ref 26–32)
MCHC: 33 g/dL (ref 32–36)
MCV: 94 fL (ref 79–95)
Platelets: 318 10*3/uL (ref 160–370)
RBC: 4 MIL/uL (ref 3.9–5.2)
RDW: 13.3 % (ref 11.7–14.4)
WBC: 10.2 10*3/uL — ABNORMAL HIGH (ref 4.0–10.0)

## 2016-10-16 LAB — COMPREHENSIVE METABOLIC PANEL
ALT: 60 U/L — ABNORMAL HIGH (ref 0–35)
AST: 35 U/L (ref 0–35)
Albumin: 4.1 g/dL (ref 3.5–5.2)
Alk Phos: 72 U/L (ref 35–105)
Anion Gap: 14 (ref 7–16)
Bilirubin,Total: 0.3 mg/dL (ref 0.0–1.2)
CO2: 24 mmol/L (ref 20–28)
Calcium: 9.5 mg/dL (ref 8.8–10.2)
Chloride: 100 mmol/L (ref 96–108)
Creatinine: 0.76 mg/dL (ref 0.51–0.95)
GFR,Black: 114 *
GFR,Caucasian: 99 *
Globulin: 2.9 g/dL (ref 2.7–4.3)
Glucose: 87 mg/dL (ref 60–99)
Lab: 13 mg/dL (ref 6–20)
Potassium: 4.5 mmol/L (ref 3.3–5.1)
Sodium: 138 mmol/L (ref 133–145)
Total Protein: 7 g/dL (ref 6.3–7.7)

## 2016-10-16 LAB — FERRITIN: Ferritin: 610 ng/mL — ABNORMAL HIGH (ref 10–120)

## 2016-10-16 LAB — IRON: Iron: 36 ug/dL (ref 34–165)

## 2016-10-16 LAB — VITAMIN B12: Vitamin B12: 1242 pg/mL (ref 232–1245)

## 2016-10-16 LAB — FOLATE: Folate: >20 ng/mL (ref >=4.6)

## 2016-10-16 MED ORDER — URSODIOL 300 MG PO CAPS *I*
300.0000 mg | ORAL_CAPSULE | Freq: Two times a day (BID) | ORAL | 5 refills | Status: DC
Start: 2016-10-16 — End: 2016-10-16

## 2016-10-16 NOTE — Patient Instructions (Signed)
[]    Educated on pureed meal plan  [x]   Increase protein supplements  []   Continue with GP1 protein supplement regimen  [x]   Continue with vitamin regimen   []   Ferocon   [x]   Multivitamin   [x]   Calcium with Vitamin D   []   Vitamin D              [x]   Vit B12              [x]    Iron              [x]   Thiamine     [x]   Start Ursodiol  []   Begin exercise as tolerated  [x]   Increase exercise as tolerated  []   Diabetic Follow-up  []   Anticoag Follow-up  []   Change Diabetic Meds

## 2016-10-16 NOTE — Addendum Note (Signed)
Addended by: Herbie DrapeLLONI, Beula Joyner on: 10/16/2016 03:49 PM     Modules accepted: Orders

## 2016-10-16 NOTE — Progress Notes (Signed)
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 Gershon Mussel'Malley, M.D., F.A.C.S.       Bariatric Vitals:  Vitals 10/16/2016 10/16/2016 10/16/2016 10/08/2016 10/07/2016   Preop Weight 236 lb 236 lb 201 lb - -   Weight 219 lb - 219 lb 227 lb 6.4 oz 227 lb 6.4 oz   BAR Weight (lbs) 219 - 219 - -   Height 5\' 1"  - 5\' 1"  - 5\' 1"    BAR Height (in) 61 - 61 - -   BMI (Calculated) 41.5 kg/m2 - 41.5 kg/m2 - 43.1 kg/m2   BAR BMI (Calculated) 41.5 kg/m2 - 41.5 kg/m2 - -   IBW in kg (Bariatric) 47.63 - 47.63 - 47.63   IBW in lbs (Bariatric) 105 - 105 - 105   Percent Excess Weight Loss -12.98 % - 18.75 % - 27.5 %   Weight Change for Visit (kg) 0 - -3.81 0 -0.82   Weight Change Total (kg) -7.71 - 8.16 11.97 11.97   Initial Excess Weight 59.4 - 43.52 - 43.52   Total Weight Loss Percent -7.2 % - 8.96 % 13.13 % 13.13 %     Vitals 10/16/2016 10/10/2016 10/10/2016 10/10/2016 10/09/2016   BP 129/73 110/65 118/63 162/80 128/77   Pulse 90 80 85 87 -   Resp 16 18 18 16 16    Temp 96.4 97 98.4 96.1 97       ACS Information:  ACS 10/16/2016 10/16/2016 10/08/2016 10/08/2016 10/07/2016   Weight Change Since Preop -7.71 8.16 11.97 - 11.97   DIAGNOSES THIS VISIT - Morbid obesity with BMI of 40.0-44.9, adult  S/P bariatric surgery  Intestinal malabsorption, unspecified type - - -   TREATMENTS - - - - -       Subjective:  Patient returns for a standard post operative evaluation. A review of pertinent symptoms reveals:  Brooke Hanson complains of  Pertinent negatives include abdominal pain, nausea, heartburn or reflux, fatigue, vomiting, constipation, difficulty swallowing and diarrhea    Patient reports rib and sternal pain    Co-Morbid Conditions:  These comorbid conditions existed prior to surgery: obstructive sleep apnea, depression, anxiety,  GERD  .    Patient does have sleep apnea and is compliant with using CPAP  Patient does not have diabetes.   Patient does not have hypertension treated with medication.  Patient does not have GERD treated with PPI or H2  Patient does not have hyperlipidemia treated with medication    Complications:  Patient was not diagnosed with venous thrombosis postoperatively.  Patient was not diagnosed with a pulmonary emobolism postoperatively.  Patient does not have a new incisional hernia    Current Medications, Vitamins, and Supplements list:  Medications/Vitamins/Supplements         Last Dose Start Date End Date Provider     auto-titrating CPAP (AUTOSET) machine Taking  --  --  [provider]     By no specified route     cetirizine (ZYRTEC) 10 MG tablet Not Taking  --  --  [provider]     Take 10 mg by mouth as needed for Allergies     docusate sodium (COLACE) 100 MG capsule Taking  10/10/16  --  Janina Mayo, MD     Take 1 capsule (100 mg total) by mouth 2 times daily     escitalopram (LEXAPRO) 10 MG tablet Taking  --  --  [provider]     Take 10 mg by mouth daily     HYDROcodone-acetaminophen (NORCO) 5-325 MG per tablet Taking  10/10/16  --  Janina Mayo, MD     Take 1-2 tablets by mouth every 6 hours as needed for Pain   Max daily dose: 8 tablets     Levonorgestrel (MIRENA, 52 MG, IU) Taking  --  --  [provider]     by Intrauterine route     omeprazole (PRILOSEC) 20 MG capsule Taking  --  --  [provider]     Take 20 mg by mouth every morning     Pediatric Multiple Vit-C-FA (CHILDRENS CHEWABLE VITAMINS) chewable tablet Taking  10/10/16  --  Janina Mayo, MD     Take 1 tablet by mouth 2 times daily     polyethylene glycol (GLYCOLAX,MIRALAX) powder packet Not Taking  10/10/16  10/17/16  Janina Mayo, MD     Take 17 g by mouth 2 times daily     promethazine (PHENERGAN) 25 MG tablet Not Taking  10/10/16  --  Janina Mayo, MD     Take 1 tablet (25 mg total) by mouth every 6  hours as needed (nausea)     ursodiol (ACTIGALL) 300 MG capsule   10/16/16  11/15/16  Rolland Porter, PA     Take 1 capsule (300 mg total) by mouth 2 times daily          Social History:  Brooke Hanson  reports that she quit smoking about 19 years ago. She has never used smokeless tobacco.   She  reports that she does not drink alcohol.  Brooke Hanson reports she does not use NSAIDS.  She does not attend support meetings.  Brooke Hanson does not exercise.    Diet History:  Brooke Hanson does adhere to the current meal plan.  Brooke Hanson is not using protein supplements.  She does not use caffeine.    Physical Exam:  Physical Exam   Constitutional: She is oriented to person, place, and time. She appears well-developed and well-nourished.   HENT:   Head: Normocephalic and atraumatic.   Eyes: EOM are normal.   Neck: Normal range of motion. Neck supple.   Cardiovascular: Normal rate, regular rhythm and normal heart sounds.    No murmur heard.  Pulmonary/Chest: Effort normal and breath sounds normal. No respiratory distress. She has no wheezes. She has no rales.   + right sided rib tenderness   Abdominal: Soft. Bowel sounds are normal. She exhibits no distension and no mass. There is no tenderness. There is no rebound.   Musculoskeletal: Normal range of motion. She exhibits no edema.   Neurological: She is alert and oriented to person, place, and time.   Skin: Skin is warm and dry. No rash noted. No erythema.   Psychiatric: She has a normal mood and affect. Her behavior is normal. Thought content normal.        Assessment:  Patient is without complaints.  Post op course has been uneventful.  Weight loss has been average.  She is compliant with diet.  Pathology: no  No results found.  In addition: Not Applicable       Plan:  Start Ursodiol, iron, Vit B 12 and  thiamine  Ice for rib pain - patient to call if sx persist or worsen  RTW  10/22/16  Brooke Riches was educated on Diet: pureed  Protein: current regimen  Exercise:  Begin exercise.     Orders Placed This Encounter   Procedures    Folate    Iron    Vitamin B12    Vitamin d 25 hydroxy, d2&d3    Ferritin    CBC    Comprehensive metabolic panel    PTH, intact       Return in about 4 weeks (around 11/13/2016).

## 2016-10-20 LAB — VITAMIN D
25-OH VIT D2: <4 ng/mL
25-OH VIT D3: 39 ng/mL
25-OH Vit Total: 39 ng/mL (ref 30–60)

## 2016-10-23 NOTE — Op Note (Signed)
PATIENT: Campbell RichesMichelle M Cashaw DOB: 1975/08/07   MR #: [id number] AGE: 41 y.o.              SURGEON: Darrell JewelWilliam E Kree Rafter, MD    ASSISTANMelvenia Beam:  Simon   SURGERY DATE:  10/08/16    PREOPERATIVE DIAGNOSIS: Morbid obesity  POSTOPERATIVE DIAGNOSIS: Morbid obesity  OPERATIVE PROCEDURE: Laparoscopic Roux-en-Y gastric bypass.   ANESTHESIA: General endotracheal.   INDICATIONS: The patient is a 41 y.o. female,  , Weight: 103.1 kg (227 lb 6.4 oz), Body mass index is 42.97 kg/(m^2)., who has failed to achieve significant sustained weight loss with nonsurgical methods.   FINDINGS AT OPERATION: Mild hepatic steatosis.    PROCEDURE: The patient was brought to the operating room and placed on the operating table in the supine position. After adequate general endotracheal anesthesia was obtained, the patient's abdomen was prepped and draped in sterile fashion. The Ioban drape was used. The abdominal cavity was entered through a small left lower quadrant incision by passing a 12 mm bladeless trocar loaded with the 0 degree 10 mm laparoscope into the abdominal cavity under laparoscopic observation and pneumoperitoneum was established to 15 mmHg pressure with carbon dioxide. A total of 4 bladeless 12 mm trocars were passed obliquely through the abdominal wall, including left upper quadrant, left flank, right upper quadrant and umbilical midline. The omentum and transverse colon were reflected exposing the ligament of Treitz. The small bowel was divided approximately 40 cm from the ligament of Treitz with the GIA-45 stapler and a second firing was made through the mesentery with the 2.5 mm cartridge. Distal bowel was measured 150 cm and this would be the length of the Roux limb. A side-to-side functional end-to-side enteroenterostomy was performed by tacking the afferent limb to the 150 cm mark on the Roux limb making parallel antimesenteric enterotomies and firing the GIA-45 stapler into the lumen of each. The resulting enterotomy was closed with  running 3-0 Vicryl. The mesenteric defect was closed with running  2-0 Ethibond suture. The stapled end of the Roux limb was passed through an avascular rent in the transverse mesocolon into the lesser sac space. The gastrocolic ligament was divided and the Roux limb was seen within the lesser sac space below.. The left lobe of the liver was retracted off the stomach with a mattress suture of 2-0 Prolene on a Keith needle. At the end of the case sutures were removed, puncture sites were cauterized and hemostatic. The phrenogastric membrane on the left side of the GE junction was sharply and bluntly resected to the angle of His. Next a window was made in the lesser omentum along the lesser curvature of the stomach approximately 1 cm inferior to the right side of the GE junction. Again the retrogastric space was entered. A transverse application of the GIA-45 stapler with the 3.5 mm cartridge was followed by multiple vertical applications of the same type of stapler to create a completely isolated proximal gastric pouch approximately 15 cc in volume. Next the stapled end of the Roux limb was passed into the retrogastric space to lie next to the proximal gastric pouch and a side-to-side functional end-to-end gastrojejunostomy was performed by first making an anterior inferior gastrotomy in the proximal gastric pouch and a matching antimesenteric enterotomy near the stapled end of the Roux limb, applying the GIA-35 stapler to two-thirds of its depth into the lumen of each and firing. The resulting enterotomy was closed with a layer of running 3-0 Vicryl and a second outer layer  of interrupted seromuscular 3-0 Vicryl. The 43 French Ewald tube easily passed across the anastomosis and was now backed into the distal esophagus. With the Roux limb occluded, the anastomosis was submerged under saline, distended with oxygen via the Ewald tube and with multiple distentions while submerged there was no evidence of leak. The left  upper quadrant was irrigated with normal saline. The Roux limb was tacked to the peritoneal undersurface of the transverse mesocolon with interrupted seromuscular 2-0 Ethibond sutures. The pneumoperitoneum was allowed to escape. The trocars were withdrawn. The wound was irrigated with normal saline and infiltrated with 0.25% Marcaine and closed with staples. All needle, sponge, and instrument counts were reported as correct.   ______________________________   Jake Bathe, MD

## 2016-11-18 ENCOUNTER — Ambulatory Visit: Payer: No Typology Code available for payment source

## 2016-11-24 ENCOUNTER — Ambulatory Visit: Payer: No Typology Code available for payment source

## 2016-12-09 ENCOUNTER — Ambulatory Visit: Payer: No Typology Code available for payment source | Attending: Surgery

## 2016-12-09 DIAGNOSIS — Z6837 Body mass index (BMI) 37.0-37.9, adult: Secondary | ICD-10-CM | POA: Insufficient documentation

## 2016-12-09 DIAGNOSIS — Z6841 Body Mass Index (BMI) 40.0 and over, adult: Secondary | ICD-10-CM | POA: Insufficient documentation

## 2016-12-09 NOTE — Progress Notes (Signed)
Date of Visit: 12/09/2016   Patient: Brooke Hanson   MR Number: [id number]   Date of Birth: 30-Jul-1975   Date of Surgery: 10/08/2016   Type of Surgery: Laparoscopic Roux-en-Y Gastric Bypass         Surgeon: Sol Passer. Gershon Mussel, M.D., F.A.C.S.       Bariatric Vitals:  Vitals 12/09/2016 10/16/2016 10/16/2016 10/16/2016 10/08/2016   Preop Weight 236 lb 236 lb 236 lb 201 lb -   Weight 196 lb 9.6 oz 219 lb - 219 lb 227 lb 6.4 oz   BAR Weight (lbs) 196.6 219 - 219 -   Height   -  -   BAR Height (in) 61 61 - 61 -   BMI (Calculated) 37.2 kg/m2 41.5 kg/m2 - 41.5 kg/m2 -   BAR BMI (Calculated) 37.2 kg/m2 41.5 kg/m2 - 41.5 kg/m2 -   IBW in kg (Bariatric) 47.63 47.63 - 47.63 -   IBW in lbs (Bariatric) 105 105 - 105 -   Percent Excess Weight Loss -30.08 % -12.98 % - 18.75 % -   Weight Change for Visit (kg) -10.16 0 - -3.81 0   Weight Change Total (kg) -17.87 -7.71 - 8.16 11.97   Initial Excess Weight 59.4 59.4 - 43.52 -   Total Weight Loss Percent -16.69 % -7.2 % - 8.96 % 13.13 %     Vitals 12/09/2016 10/16/2016 10/10/2016 10/10/2016 10/10/2016   BP 122/73 129/73 110/65 118/63 162/80   Pulse 76 90 80 85 87   Resp Temp 95.5 96.4 97 98.4 96.1       ACS Information:  ACS 12/09/2016 12/09/2016 10/16/2016 10/16/2016 10/16/2016   Weight Change Since Preop -17.87 - -7.71 8.16 -   DIAGNOSES THIS VISIT - BMI 37.0-37.9, adult - - Morbid obesity with BMI of 40.0-44.9, adult  S/P bariatric surgery  Intestinal malabsorption, unspecified type   TREATMENTS - - - - -       Current Medications, Vitamins, and Supplements list:  Medications/Vitamins/Supplements         Last Dose Start Date End Date Provider     auto-titrating CPAP (AUTOSET) machine Taking  --  --  [provider]     By no specified route     cetirizine (ZYRTEC) 10 MG tablet Not Taking  --  --  [provider]     Take 10 mg by mouth as needed for Allergies     docusate sodium (COLACE) 100 MG capsule Taking  10/10/16  --  Janina Mayo, MD     Take 1 capsule (100  mg total) by mouth 2 times daily     escitalopram (LEXAPRO) 10 MG tablet Taking  --  --  [provider]     Take 10 mg by mouth daily     HYDROcodone-acetaminophen (NORCO) 5-325 MG per tablet Taking  10/10/16  --  Janina Mayo, MD     Take 1-2 tablets by mouth every 6 hours as needed for Pain   Max daily dose: 8 tablets     Levonorgestrel (MIRENA, 52 MG, IU) Taking  --  --  [provider]     by Intrauterine route     omeprazole (PRILOSEC) 20 MG capsule Taking  --  --  [provider]     Take 20 mg by mouth every morning     Pediatric Multiple Vit-C-FA (CHILDRENS CHEWABLE VITAMINS) chewable tablet Taking  10/10/16  --  Janina Mayo, MD     Take 1 tablet by mouth 2 times daily     promethazine (PHENERGAN) 25 MG tablet Not Taking  10/10/16  --  Janina Mayo, MD     Take 1 tablet (25 mg total) by mouth every 6 hours as needed (nausea)     ursodiol (ACTIGALL) 300 MG capsule   10/16/16  --  Rolland Porter, PA     TAKE 1 CAPSULE(300 MG) BY MOUTH TWICE DAILY          Have you been to a hospital or seen by a doctor for any reason since your surgery? No   Were you seen at anywhere other than [facility] or [facility]? No  Was this visit for anything other than routine follow-up? No    Review of Systems:  ROS  She does have these associated symptoms obstructive sleep apnea, depression, anxiety, GERD  .    Excercise History:  Brooke Hanson exercises 2 times a week.    Diet History:  Brooke Hanson does adhere to the current meal plan.  Brooke Hanson is using protein supplements.  She does not use caffeine.    Assessment:  Patient is without complaints.  Post op course has been uneventful.  Weight loss has been above average.  She is compliant with diet and protein supplements.  In addition: Not Applicable     Plan:  Brooke Hanson was educated on Diet: soft food  Protein: increase supplements   Exercise: Increase exercise as tolerated.  Follow up in 1 month(s)

## 2017-01-06 ENCOUNTER — Encounter: Payer: Self-pay | Admitting: Bariatrics

## 2017-01-06 ENCOUNTER — Ambulatory Visit: Payer: No Typology Code available for payment source | Attending: Bariatrics | Admitting: Bariatrics

## 2017-01-06 VITALS — BP 131/83 | HR 85 | Temp 96.6°F | Resp 18 | Ht 60.98 in | Wt 187.2 lb

## 2017-01-06 DIAGNOSIS — K909 Intestinal malabsorption, unspecified: Secondary | ICD-10-CM | POA: Insufficient documentation

## 2017-01-06 DIAGNOSIS — Z9884 Bariatric surgery status: Secondary | ICD-10-CM | POA: Insufficient documentation

## 2017-01-06 DIAGNOSIS — Z6835 Body mass index (BMI) 35.0-35.9, adult: Secondary | ICD-10-CM | POA: Insufficient documentation

## 2017-01-06 LAB — CBC
Hematocrit: 39 % (ref 34–45)
Hemoglobin: 12.7 g/dL (ref 11.2–15.7)
MCH: 31 pg (ref 26–32)
MCHC: 33 g/dL (ref 32–36)
MCV: 94 fL (ref 79–95)
Platelets: 247 10*3/uL (ref 160–370)
RBC: 4.1 MIL/uL (ref 3.9–5.2)
RDW: 12.8 % (ref 11.7–14.4)
WBC: 6.9 10*3/uL (ref 4.0–10.0)

## 2017-01-06 LAB — COMPREHENSIVE METABOLIC PANEL
ALT: 38 U/L — ABNORMAL HIGH (ref 0–35)
AST: 27 U/L (ref 0–35)
Albumin: 3.8 g/dL (ref 3.5–5.2)
Alk Phos: 75 U/L (ref 35–105)
Anion Gap: 11 (ref 7–16)
Bilirubin,Total: 0.4 mg/dL (ref 0.0–1.2)
CO2: 25 mmol/L (ref 20–28)
Calcium: 9 mg/dL (ref 8.8–10.2)
Chloride: 105 mmol/L (ref 96–108)
Creatinine: 0.71 mg/dL (ref 0.51–0.95)
GFR,Black: 124 *
GFR,Caucasian: 108 *
Globulin: 2.7 g/dL (ref 2.7–4.3)
Glucose: 93 mg/dL (ref 60–99)
Lab: 7 mg/dL (ref 6–20)
Potassium: 3.7 mmol/L (ref 3.3–5.1)
Sodium: 141 mmol/L (ref 133–145)
Total Protein: 6.5 g/dL (ref 6.3–7.7)

## 2017-01-06 LAB — FERRITIN: Ferritin: 435 ng/mL — ABNORMAL HIGH (ref 10–120)

## 2017-01-06 LAB — PTH, INTACT: Intact PTH: 36.7 pg/mL (ref 15.0–65.0)

## 2017-01-06 LAB — FOLATE: Folate: 6.8 ng/mL (ref >=4.6)

## 2017-01-06 LAB — IRON: Iron: 73 ug/dL (ref 34–165)

## 2017-01-06 LAB — VITAMIN B12: Vitamin B12: 420 pg/mL (ref 232–1245)

## 2017-01-06 NOTE — Patient Instructions (Signed)
Continue supplements     bariatric information handout given

## 2017-01-06 NOTE — Addendum Note (Signed)
Addended by: Katherine Syme D on: 01/06/2017 10:09 AM     Modules accepted: Orders

## 2017-01-06 NOTE — Progress Notes (Signed)
[facility] at [facility]  Postoperative Visit  GP3 not taking vitamins    Date of Visit: 01/06/2017   Patient: Brooke Hanson   MR Number: [id number]   Date of Birth: [birth year]   Date of Surgery: 10/08/2016   Type of Surgery: Laparoscopic Roux-en-Y Gastric Bypass         Surgeon: Sol Passer. Gershon Mussel, M.D., F.A.C.S.       Bariatric Vitals:  Vitals 01/06/2017 12/09/2016 10/16/2016 10/16/2016 10/16/2016   Preop Weight 236 lb 236 lb 236 lb 236 lb 201 lb   Weight 187 lb 3.2 oz 196 lb 9.6 oz 219 lb - 219 lb   BAR Weight (lbs) 187.2 196.6 219 - 219   Height 5' 0.984" 5\' 1"  5\' 1"  - 5\' 1"    BAR Height (in) 60.98 61 61 - 61   BMI (Calculated) 35.5 kg/m2 37.2 kg/m2 41.5 kg/m2 - 41.5 kg/m2   BAR BMI (Calculated) 35.5 kg/m2 37.2 kg/m2 41.5 kg/m2 - 41.5 kg/m2   IBW in kg (Bariatric) 47.59 47.63 47.63 - 47.63   IBW in lbs (Bariatric) 104.92 105 105 - 105   Percent Excess Weight Loss -37.23 % -30.08 % -12.98 % - 18.75 %   Weight Change for Visit (kg) -4.26 -10.16 0 - -3.81   Weight Change Total (kg) -22.13 -17.87 -7.71 - 8.16   Initial Excess Weight 59.44 59.4 59.4 - 43.52   Total Weight Loss Percent -20.68 % -16.69 % -7.2 % - 8.96 %     Vitals 01/06/2017 12/09/2016 10/16/2016 10/10/2016 10/10/2016   BP 131/83 122/73 129/73 110/65 118/63   Pulse 85 76 90 80 85   Resp 18 16 16 18 18    Temp 96.6 95.5 96.4 97 98.4       ACS Information:  ACS 01/06/2017 01/06/2017 12/09/2016 12/09/2016 10/16/2016   Weight Change Since Preop -22.13 - -17.87 - -7.71   DIAGNOSES THIS VISIT - BMI 35.0-35.9,adult  Bariatric surgery status  Intestinal malabsorption, unspecified type - BMI 37.0-37.9, adult  Morbid obesity  Morbid obesity with BMI of 40.0-44.9, adult -   TREATMENTS - - - - -       Subjective:  Patient returns for a standard post operative evaluation. A review of pertinent symptoms reveals:  ELVETA RAPE complains of  Pertinent negatives include abdominal pain, nausea and heartburn or reflux      Co-Morbid Conditions:  These comorbid  conditions existed prior to surgery: obstructive sleep apnea, depression, anxiety, GERD  .    Patient does have sleep apnea and is compliant with using CPAP  Patient does not have diabetes.   Patient does not have hypertension treated with medication.  Patient does not have GERD treated with PPI or H2  Patient does not have hyperlipidemia treated with medication    Complications:  Patient was not diagnosed with venous thrombosis postoperatively.  Patient was not diagnosed with a pulmonary emobolism postoperatively.  Patient does not have a new incisional hernia    Current Medications, Vitamins, and Supplements list:  Medications/Vitamins/Supplements         Last Dose Start Date End Date Provider     auto-titrating CPAP (AUTOSET) machine Taking  --  --  [provider]     By no specified route     cetirizine (ZYRTEC) 10 MG tablet As Needed  --  --  [provider]     Take 10 mg by mouth as needed for Allergies     cyanocobalamin (VITAMIN B-12)  1000 MCG tablet Not Taking  --  --  [provider]     Take 1,000 mcg by mouth daily     docusate sodium (COLACE) 100 MG capsule Not Taking  10/10/16  --  Janina MayoWang, Boru, MD     Take 1 capsule (100 mg total) by mouth 2 times daily     escitalopram (LEXAPRO) 10 MG tablet Taking  --  --  [provider]     Take 10 mg by mouth daily     ferrous sulfate 325 (65 FE) MG tablet Not Taking  --  --  [provider]     Take 325 mg by mouth daily (with breakfast)     HYDROcodone-acetaminophen (NORCO) 5-325 MG per tablet Not Taking  10/10/16  --  Janina MayoWang, Boru, MD     Take 1-2 tablets by mouth every 6 hours as needed for Pain   Max daily dose: 8 tablets     Levonorgestrel (MIRENA, 52 MG, IU) Taking  --  --  [provider]     by Intrauterine route     omeprazole (PRILOSEC) 20 MG capsule   --  --  [provider]     Take 20 mg by mouth every morning     Pediatric Multiple Vit-C-FA (CHILDRENS CHEWABLE VITAMINS) chewable tablet  Not Taking  10/10/16  --  Janina MayoWang, Boru, MD     Take 1 tablet by mouth 2 times daily          Social History:  Campbell RichesMichelle M Dimercurio  reports that she quit smoking about 19 years ago. She has never used smokeless tobacco.   She  reports that she does not drink alcohol.  Marcelino DusterMichelle reports she does not use NSAIDS.  She does not attend support meetings.  Gagandeep exercises 2 times a week.    Diet History:  Campbell RichesMichelle M Tung does adhere to the current meal plan.  Marcelino DusterMichelle is using protein supplements.  She does not use caffeine.    Physical Exam:  Physical Exam   Vitals reviewed.  Constitutional: She is oriented to person, place, and time. She appears well-developed.   HENT:   Head: Normocephalic.   Neck: Normal range of motion.   Cardiovascular: Normal rate and regular rhythm.    Pulmonary/Chest: Effort normal and breath sounds normal.   Abdominal: Soft. Bowel sounds are normal. There is no tenderness.   Musculoskeletal: Normal range of motion.   Neurological: She is alert and oriented to person, place, and time.   Skin: Skin is warm and dry.   Psychiatric: She has a normal mood and affect.        Assessment:  Patient is without complaints.  Post op course has been uneventful.  Weight loss has been average.  She is compliant with diet.       Plan:  Campbell RichesMichelle M Pettis was educated on Diet: lifestyle  Exercise: Continue exercise.     Orders Placed This Encounter   Procedures    Folate    Iron    Vitamin B12    Vitamin d 25 hydroxy, d2&d3    Ferritin    CBC    Comprehensive metabolic panel    PTH, intact       No Follow-up on file.

## 2017-01-08 LAB — VITAMIN D
25-OH VIT D2: <4 ng/mL
25-OH VIT D3: 31 ng/mL
25-OH Vit Total: 31 ng/mL (ref 30–60)

## 2017-03-06 ENCOUNTER — Ambulatory Visit: Payer: No Typology Code available for payment source | Attending: Bariatrics | Admitting: Bariatrics

## 2017-03-06 ENCOUNTER — Encounter: Payer: Self-pay | Admitting: Bariatrics

## 2017-03-06 VITALS — BP 121/68 | HR 76 | Temp 97.5°F | Resp 18 | Ht 60.98 in | Wt 168.0 lb

## 2017-03-06 DIAGNOSIS — K909 Intestinal malabsorption, unspecified: Secondary | ICD-10-CM | POA: Insufficient documentation

## 2017-03-06 DIAGNOSIS — Z9884 Bariatric surgery status: Secondary | ICD-10-CM | POA: Insufficient documentation

## 2017-03-06 DIAGNOSIS — Z6831 Body mass index (BMI) 31.0-31.9, adult: Secondary | ICD-10-CM | POA: Insufficient documentation

## 2017-03-06 LAB — VITAMIN B12: Vitamin B12: 605 pg/mL (ref 232–1245)

## 2017-03-06 LAB — COMPREHENSIVE METABOLIC PANEL
ALT: 23 U/L (ref 0–35)
AST: 18 U/L (ref 0–35)
Albumin: 3.9 g/dL (ref 3.5–5.2)
Alk Phos: 80 U/L (ref 35–105)
Anion Gap: 13 (ref 7–16)
Bilirubin,Total: 0.4 mg/dL (ref 0.0–1.2)
CO2: 23 mmol/L (ref 20–28)
Calcium: 8.8 mg/dL (ref 8.8–10.2)
Chloride: 107 mmol/L (ref 96–108)
Creatinine: 0.79 mg/dL (ref 0.51–0.95)
GFR,Black: 109 *
GFR,Caucasian: 94 *
Globulin: 3 g/dL (ref 2.7–4.3)
Glucose: 87 mg/dL (ref 60–99)
Lab: 14 mg/dL (ref 6–20)
Potassium: 3.9 mmol/L (ref 3.3–5.1)
Sodium: 143 mmol/L (ref 133–145)
Total Protein: 6.9 g/dL (ref 6.3–7.7)

## 2017-03-06 LAB — CBC
Hematocrit: 40 % (ref 34–45)
Hemoglobin: 13.5 g/dL (ref 11.2–15.7)
MCH: 32 pg (ref 26–32)
MCHC: 34 g/dL (ref 32–36)
MCV: 94 fL (ref 79–95)
Platelets: 260 10*3/uL (ref 160–370)
RBC: 4.3 MIL/uL (ref 3.9–5.2)
RDW: 13.5 % (ref 11.7–14.4)
WBC: 9.5 10*3/uL (ref 4.0–10.0)

## 2017-03-06 LAB — FOLATE: Folate: 13.4 ng/mL (ref >=4.6)

## 2017-03-06 LAB — IRON: Iron: 46 ug/dL (ref 34–165)

## 2017-03-06 LAB — FERRITIN: Ferritin: 378 ng/mL — ABNORMAL HIGH (ref 10–120)

## 2017-03-06 NOTE — Progress Notes (Signed)
[facility] at [facility]  Postoperative Visit  GP6    Date of Visit: 03/06/2017   Patient: Brooke Hanson   MR Number: [id number]   Date of Birth: [birth year]   Date of Surgery: 10/08/2016   Type of Surgery: Laparoscopic Roux-en-Y Gastric Bypass         Surgeon: Sol PasserWilliam E. Gershon Mussel'Malley, M.D., F.A.C.S.       Bariatric Vitals:  Vitals 03/06/2017 01/06/2017 12/09/2016 10/16/2016 10/16/2016   Preop Weight 236 lb 236 lb 236 lb 236 lb 236 lb   Weight 168 lb 187 lb 3.2 oz 196 lb 9.6 oz 219 lb -   BAR Weight (lbs) - 187.2 196.6 219 -   Height 5' 0.984" 5' 0.984" 5\' 1"  5\' 1"  -   BAR Height (in) - 60.98 61 61 -   BMI (Calculated) 31.8 kg/m2 35.5 kg/m2 37.2 kg/m2 41.5 kg/m2 -   BAR BMI (Calculated) - 35.5 kg/m2 37.2 kg/m2 41.5 kg/m2 -   IBW in kg (Bariatric) 47.59 47.59 47.63 47.63 -   IBW in lbs (Bariatric) 104.92 104.92 105 105 -   Percent Excess Weight Loss -51.88 % -37.23 % -30.08 % -12.98 % -   Weight Change for Visit (kg) -8.71 -4.26 -10.16 0 -   Weight Change Total (kg) -30.84 -22.13 -17.87 -7.71 -   Initial Excess Weight 59.44 59.44 59.4 59.4 -   Total Weight Loss Percent -28.81 % -20.68 % -16.69 % -7.2 % -     Vitals 03/06/2017 01/06/2017 12/09/2016 10/16/2016 10/10/2016   BP 121/68 131/83 122/73 129/73 110/65   Pulse 76 85 76 90 80   Resp 18 18 16 16 18    Temp 97.5 96.6 95.5 96.4 97       ACS Information:  ACS 03/06/2017 03/06/2017 01/06/2017 01/06/2017 12/09/2016   Weight Change Since Preop -30.84 - -22.13 - -17.87   DIAGNOSES THIS VISIT - BMI 31.0-31.9,adult  Bariatric surgery status  Intestinal malabsorption, unspecified type - BMI 35.0-35.9,adult  Bariatric surgery status  Intestinal malabsorption, unspecified type -   TREATMENTS - - - - -       Subjective:  Patient returns for a standard post operative evaluation. A review of pertinent symptoms reveals:  Brooke Hanson complains of  Pertinent negatives include abdominal pain, nausea and heartburn or reflux      Co-Morbid Conditions:  These comorbid conditions  existed prior to surgery: obstructive sleep apnea, depression, anxiety, GERD  .    Patient does have sleep apnea and is compliant with using CPAP  Patient does not have diabetes.   Patient does not have hypertension treated with medication.  Patient does not have GERD treated with PPI or H2  Patient does not have hyperlipidemia treated with medication    Complications:  Patient was not diagnosed with venous thrombosis postoperatively.  Patient was not diagnosed with a pulmonary emobolism postoperatively.  Patient does not have a new incisional hernia    Current Medications, Vitamins, and Supplements list:  Medications/Vitamins/Supplements         Last Dose Start Date End Date Provider     auto-titrating CPAP (AUTOSET) machine Taking  --  --  [provider]     By no specified route     calcium citrate-vitamin D (CALCIUM CITRATE + D) 315-250 MG-UNIT per tablet Not Taking  --  --  [provider]     Take 2 tablets by mouth 2 times daily     cetirizine (ZYRTEC) 10 MG tablet  Not Taking  --  --  [provider]     Take 10 mg by mouth as needed for Allergies     cyanocobalamin (VITAMIN B-12) 1000 MCG tablet Taking  --  --  [provider]     Take 1,000 mcg by mouth daily     docusate sodium (COLACE) 100 MG capsule Not Taking  10/10/16  --  Janina MayoWang, Boru, MD     Take 1 capsule (100 mg total) by mouth 2 times daily     escitalopram (LEXAPRO) 10 MG tablet Taking  --  --  [provider]     Take 10 mg by mouth daily     ferrous sulfate 325 (65 FE) MG tablet Taking  --  --  [provider]     Take 325 mg by mouth daily (with breakfast)     Levonorgestrel (MIRENA, 52 MG, IU) Taking  --  --  [provider]     by Intrauterine route     multi-vitamin (MULTIVITAMIN) per tablet Taking  --  --  [provider]     Take 1 tablet by mouth daily          Social History:  Brooke Hanson  reports that she quit smoking about 20 years ago. She has never used  smokeless tobacco.   She  reports that she does not drink alcohol.  Brooke Hanson reports she does not use NSAIDS.  She does not attend support meetings.  Brooke Hanson does not exercise.    Diet History:  Brooke Hanson does adhere to the current meal plan.  Brooke Hanson is not using protein supplements.  She does use caffeine.    Physical Exam:  Physical Exam   Vitals reviewed.  Constitutional: She is oriented to person, place, and time. She appears well-developed.   HENT:   Head: Normocephalic.   Neck: Normal range of motion.   Cardiovascular: Normal rate and regular rhythm.    Pulmonary/Chest: Effort normal and breath sounds normal.   Abdominal: Soft. Bowel sounds are normal. There is no tenderness.   Neurological: She is alert and oriented to person, place, and time.   Skin: Skin is warm and dry.   Psychiatric: She has a normal mood and affect.        Assessment:  Patient is without complaints.  Post op course has been uneventful.  Weight loss has been average.  She is compliant with diet.       Plan:  Brooke Hanson was educated on Diet: lifestyle  Exercise: Begin exercise   Can use curcumin for pain.     Orders Placed This Encounter   Procedures    Folate    Iron    Vitamin B12    Vitamin d 25 hydroxy, d2&d3    Ferritin    CBC    Comprehensive metabolic panel       No Follow-up on file.

## 2017-03-06 NOTE — Addendum Note (Signed)
Addended by: Windy CannyRAVIS, Glenice Ciccone on: 03/06/2017 02:48 PM     Modules accepted: Orders

## 2017-03-06 NOTE — Patient Instructions (Signed)
You can use curcumin for pain    Bariatric hand out given

## 2017-03-09 LAB — VITAMIN D
25-OH VIT D2: <4 ng/mL
25-OH VIT D3: 29 ng/mL
25-OH Vit Total: 29 ng/mL — ABNORMAL LOW (ref 30–60)

## 2017-10-03 DIAGNOSIS — J309 Allergic rhinitis, unspecified: Secondary | ICD-10-CM | POA: Insufficient documentation

## 2017-10-03 DIAGNOSIS — G4719 Other hypersomnia: Secondary | ICD-10-CM

## 2017-10-20 ENCOUNTER — Ambulatory Visit: Payer: No Typology Code available for payment source | Attending: Bariatrics | Admitting: Bariatrics

## 2017-10-20 VITALS — BP 111/67 | HR 84 | Temp 97.2°F | Ht 60.98 in | Wt 125.2 lb

## 2017-10-20 DIAGNOSIS — Z9884 Bariatric surgery status: Secondary | ICD-10-CM | POA: Insufficient documentation

## 2017-10-20 DIAGNOSIS — K909 Intestinal malabsorption, unspecified: Secondary | ICD-10-CM

## 2017-10-20 LAB — COMPREHENSIVE METABOLIC PANEL
ALT: 16 U/L (ref 0–35)
AST: 13 U/L (ref 0–35)
Albumin: 4.2 g/dL (ref 3.5–5.2)
Alk Phos: 82 U/L (ref 35–105)
Anion Gap: 10 (ref 7–16)
Bilirubin,Total: 0.7 mg/dL (ref 0.0–1.2)
CO2: 26 mmol/L (ref 20–28)
Calcium: 9.3 mg/dL (ref 8.8–10.2)
Chloride: 103 mmol/L (ref 96–108)
Creatinine: 0.75 mg/dL (ref 0.51–0.95)
GFR,Black: 115 *
GFR,Caucasian: 100 *
Globulin: 2.5 g/dL — ABNORMAL LOW (ref 2.7–4.3)
Glucose: 87 mg/dL (ref 60–99)
Lab: 11 mg/dL (ref 6–20)
Potassium: 4.2 mmol/L (ref 3.3–5.1)
Sodium: 139 mmol/L (ref 133–145)
Total Protein: 6.7 g/dL (ref 6.3–7.7)

## 2017-10-20 LAB — CBC
Hematocrit: 39 % (ref 34–45)
Hemoglobin: 13.2 g/dL (ref 11.2–15.7)
MCH: 32 pg (ref 26–32)
MCHC: 34 g/dL (ref 32–36)
MCV: 95 fL (ref 79–95)
Platelets: 264 10*3/uL (ref 160–370)
RBC: 4.1 MIL/uL (ref 3.9–5.2)
RDW: 12.2 % (ref 11.7–14.4)
WBC: 8.1 10*3/uL (ref 4.0–10.0)

## 2017-10-20 LAB — FOLATE: Folate: 5.9 ng/mL (ref >=4.6)

## 2017-10-20 LAB — VITAMIN B12: Vitamin B12: 234 pg/mL (ref 232–1245)

## 2017-10-20 LAB — IRON: Iron: 52 ug/dL (ref 34–165)

## 2017-10-20 LAB — PTH, INTACT: Intact PTH: 28.9 pg/mL (ref 15.0–65.0)

## 2017-10-20 LAB — FERRITIN: Ferritin: 376 ng/mL — ABNORMAL HIGH (ref 10–120)

## 2017-10-20 NOTE — Patient Instructions (Signed)
You can try patch MD topical multivitamin with Iron if you do not take the pills      Have your gyn order a dexa scan indication is bariatric surgery

## 2017-10-20 NOTE — Addendum Note (Signed)
Addended by: Herbie DrapeLLONI, Kyerra Vargo on: 10/20/2017 11:31 AM     Modules accepted: Orders

## 2017-10-20 NOTE — Progress Notes (Signed)
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 Gershon Mussel'Malley, M.D., F.A.C.S.       Bariatric Vitals:  Vitals 10/20/2017 10/20/2017 03/06/2017 01/06/2017 12/09/2016   Preop Weight - 236 lb 236 lb 236 lb 236 lb   Weight 125 lb 3.2 oz 125 lb 3.2 oz 168 lb 187 lb 3.2 oz 196 lb 9.6 oz   BAR Weight (lbs) 125.2 - - 187.2 196.6   Height 5' 0.984" - 5' 0.984" 5' 0.984" 5\' 1"    BAR Height (in) 60.98 - - 60.98 61   BMI (Calculated) 23.7 kg/m2 - 31.8 kg/m2 35.5 kg/m2 37.2 kg/m2   BAR BMI (Calculated) 23.7 kg/m2 - - 35.5 kg/m2 37.2 kg/m2   IBW in kg (Bariatric) 47.59 - 47.59 47.59 47.63   IBW in lbs (Bariatric) 104.92 - 104.92 104.92 105   Percent Excess Weight Loss -84.54 % - -51.88 % -37.23 % -30.08 %   Weight Change for Visit (kg) 0 -19.41 -8.71 -4.26 -10.16   Weight Change Total (kg) -50.25 -50.25 -30.84 -22.13 -17.87   Initial Excess Weight 59.44 - 59.44 59.44 59.4   Total Weight Loss Percent -46.95 % -46.95 % -28.81 % -20.68 % -16.69 %     Vitals 10/20/2017 03/06/2017 01/06/2017 12/09/2016 10/16/2016   BP 111/67 121/68 131/83 122/73 129/73   Pulse 84 76 85 76 90   Resp - 18 18 16 16    Temp 97.2 97.5 96.6 95.5 96.4       ACS Information:  ACS 10/20/2017 10/20/2017 10/03/2017 03/06/2017 03/06/2017   Weight Change Since Preop -50.25 -50.25 - -30.84 -   DIAGNOSES THIS VISIT Bariatric surgery status  Intestinal malabsorption, unspecified type - Allergic rhinitis  Excessive daytime sleepiness - BMI 31.0-31.9,adult  Bariatric surgery status  Intestinal malabsorption, unspecified type   TREATMENTS - - - - -       Subjective:  Patient returns for a standard post operative evaluation. A review of pertinent symptoms reveals:  Brooke Hanson complains of  Pertinent negatives include abdominal pain, nausea  and heartburn or reflux      Co-Morbid Conditions:  These comorbid conditions existed prior to surgery: obstructive sleep apnea, depression, anxiety, GERD  .    Patient does have sleep apnea and is compliant with using CPAP  Patient does not have diabetes.   Patient does not have hypertension treated with medication.  Patient does not have GERD treated with PPI or H2  Patient does not have hyperlipidemia treated with medication    Complications:  Patient was not diagnosed with venous thrombosis postoperatively.  Patient was not diagnosed with a pulmonary emobolism postoperatively.  Patient does not have a new incisional hernia    Current Medications, Vitamins, and Supplements list:  Medications/Vitamins/Supplements         Last Dose Start Date End Date Provider     auto-titrating CPAP (AUTOSET) machine Taking  --  --  [provider]     By no specified route     calcium citrate-vitamin D (CALCIUM CITRATE + D) 315-250 MG-UNIT per tablet Not Taking  --  --  [provider]     Take 2 tablets by mouth 2 times daily  cetirizine (ZYRTEC) 10 MG tablet Not Taking  --  --  [provider]     Take 10 mg by mouth as needed for Allergies     cyanocobalamin (VITAMIN B-12) 1000 MCG tablet Not Taking  --  --  [provider]     Take 1,000 mcg by mouth daily     docusate sodium (COLACE) 100 MG capsule Not Taking  10/10/16  --  Janina MayoWang, Boru, MD     Take 1 capsule (100 mg total) by mouth 2 times daily     escitalopram (LEXAPRO) 10 MG tablet Not Taking  --  --  [provider]     Take 10 mg by mouth daily     ferrous sulfate 325 (65 FE) MG tablet Not Taking  --  --  [provider]     Take 325 mg by mouth daily (with breakfast)     Levonorgestrel (MIRENA, 52 MG, IU) Taking  --  --  [provider]     by Intrauterine route     multi-vitamin (MULTIVITAMIN) per tablet Not Taking  --  --  [provider]     Take 1 tablet by mouth daily          Social  History:  Brooke Hanson  reports that she quit smoking about 20 years ago. She has never used smokeless tobacco.   She  reports that she does not drink alcohol.  Brooke Hanson reports she does not use NSAIDS.  She does not attend support meetings.  Brooke Hanson does not exercise.    Diet History:  Brooke Hanson does adhere to the current meal plan.  Brooke Hanson is not using protein supplements.  She does use caffeine.    Physical Exam:  Physical Exam   Vitals reviewed.  Constitutional: She is oriented to person, place, and time. She appears well-developed.   HENT:   Head: Normocephalic.   Neck: Normal range of motion.   Cardiovascular: Normal rate and regular rhythm.    Pulmonary/Chest: Effort normal and breath sounds normal.   Abdominal: Soft. Bowel sounds are normal. There is no tenderness.   Musculoskeletal: Normal range of motion.   Neurological: She is alert and oriented to person, place, and time.   Skin: Skin is warm and dry.   Psychiatric: She has a normal mood and affect.        Assessment:  Patient is without complaints.  Post op course has been uneventful.  Weight loss has been above average.  She is compliant with diet.       Plan:  Brooke RichesMichelle M Ketelsen was educated on Diet: lifestyle  Exercise: Begin exercise.     Orders Placed This Encounter   Procedures    Folate    Iron    Vitamin B12    Vitamin d 25 hydroxy, d2&d3    Ferritin    CBC    Comprehensive metabolic panel    PTH, intact       No Follow-up on file.

## 2017-10-22 LAB — VITAMIN D
25-OH VIT D2: <4 ng/mL
25-OH VIT D3: 33 ng/mL
25-OH Vit Total: 33 ng/mL (ref 30–60)

## 2017-10-23 ENCOUNTER — Telehealth: Payer: Self-pay | Admitting: Bariatrics

## 2017-10-23 NOTE — Telephone Encounter (Signed)
Spoke with pt will take B12 daily is now compliant with her vitamins. Discussed elevated ferritin and taht inflammation can cause this.

## 2018-03-04 ENCOUNTER — Other Ambulatory Visit: Payer: Self-pay | Admitting: Obstetrics and Gynecology

## 2018-03-04 ENCOUNTER — Other Ambulatory Visit: Payer: Self-pay | Admitting: Gastroenterology

## 2018-03-04 DIAGNOSIS — Z1231 Encounter for screening mammogram for malignant neoplasm of breast: Secondary | ICD-10-CM

## 2018-03-08 ENCOUNTER — Ambulatory Visit
Admission: RE | Admit: 2018-03-08 | Discharge: 2018-03-08 | Disposition: A | Discharge status: Home / Self Care | Payer: No Typology Code available for payment source | Source: Ambulatory Visit | Attending: Obstetrics and Gynecology | Admitting: Obstetrics and Gynecology

## 2018-03-08 ENCOUNTER — Other Ambulatory Visit
Admission: RE | Admit: 2018-03-08 | Discharge: 2018-03-08 | Disposition: A | Discharge status: Home / Self Care | Payer: No Typology Code available for payment source | Source: Ambulatory Visit | Attending: Obstetrics and Gynecology | Admitting: Obstetrics and Gynecology

## 2018-03-08 DIAGNOSIS — N951 Menopausal and female climacteric states: Secondary | ICD-10-CM | POA: Insufficient documentation

## 2018-03-08 DIAGNOSIS — Z01419 Encounter for gynecological examination (general) (routine) without abnormal findings: Secondary | ICD-10-CM | POA: Insufficient documentation

## 2018-03-08 DIAGNOSIS — Z1231 Encounter for screening mammogram for malignant neoplasm of breast: Secondary | ICD-10-CM | POA: Insufficient documentation

## 2018-03-08 LAB — T4, FREE: Free T4: 1.3 ng/dL (ref 0.9–1.7)

## 2018-03-08 LAB — FOLLICLE STIMULATING HORMONE: FSH: 2.7 m[IU]/mL

## 2018-03-08 LAB — TSH: TSH: 0.78 u[IU]/mL (ref 0.27–4.20)

## 2018-03-16 ENCOUNTER — Ambulatory Visit: Payer: No Typology Code available for payment source | Attending: Physician Assistant | Admitting: Physician Assistant

## 2018-03-16 ENCOUNTER — Encounter: Payer: Self-pay | Admitting: Physician Assistant

## 2018-03-16 VITALS — BP 96/62 | HR 74 | Ht 61.0 in | Wt 129.2 lb

## 2018-03-16 DIAGNOSIS — G4733 Obstructive sleep apnea (adult) (pediatric): Secondary | ICD-10-CM | POA: Insufficient documentation

## 2018-03-16 DIAGNOSIS — F32A Depression, unspecified: Secondary | ICD-10-CM | POA: Insufficient documentation

## 2018-03-16 NOTE — Progress Notes (Signed)
[facility] - New Patient Visit    Referred to the [facility] by Dr. Lind Guest, Otho Perl, MD for evaluation.    History:     Brooke Hanson is a 43 y.o. 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 She presents today for evaluation of her obstructive sleep apnea.    Marcelino Duster underwent PSG at [facility] on December 11, 2011 which revealed severe obstructive sleep apnea AHI=63/hr, oxygen saturation nadir=84%. She currently is intermittently using her CPAP ResMed AirSense 10 AutoSet and tolerating this well. She admits that she does not bring her device with her when she stays at her boyfriends house and she does not snore when she does not use the device or when using the CPAP. She admits that regardless of with or without the device she continues to experience daytime sleepiness which is lifelong for her. She naps in the evening time however this is her baseline and she is not bothered by this. Her sleep is fragmented due to her boyfriend's snoring waking her up and night sweats. She does not snore herself awake or have apneic episodes.     Weight changes: 110-120 pound weight loss since gastric bypass 10/08/16      Device: ResMed AirSense 10 AutoSet   Nights used: 25 out of 30 nights (83%)   Nights used ? 4 hours: 24 out of 30 nights (80%)   Average nightly usage: 7 hours and 17 minutes   Pressure Settings: 7 - 15 cmH2O   Median Pressure: 7.7 cmH2O   95th percentile Pressure: 9.7 cmH2O   Estimated residual AHI: 1.2 events/hour of sleep   Central Apnea Index: 0.8 events/hour of sleep   Obstructive Apnea Index: 0.2 events/hour of sleep   Estimated 95th percentile Leaks: 9.7 L/min   Interface Style: Nasal pillows         QUESTIONNAIRES:     Recent Review Flowsheet Data     Epworth Sleep Scale 03/16/2018    Sitting and reading 3    Watching TV 3    Sitting inactive in a public place (e.g a theater or a meeting) 0     As a passenger in a car for an hour without a break 2    Lying down to rest in the afternoon when circumstances permit 3    Sitting and talking to someone 0    Sitting quietly after a lunch without alcohol 1    In a car, while stopped for a few minutes in traffic 0    EPWORTH TOTAL  12         MEDICATIONS:     Current Outpatient Medications   Medication Sig    multi-vitamin (MULTIVITAMIN) per tablet Take 1 tablet by mouth daily    calcium citrate-vitamin D (CALCIUM CITRATE + D) 315-250 MG-UNIT per tablet Take 2 tablets by mouth 2 times daily    ferrous sulfate 325 (65 FE) MG tablet Take 325 mg by mouth daily (with breakfast)    cyanocobalamin (VITAMIN B-12) 1000 MCG tablet Take 1,000 mcg by mouth daily    escitalopram (LEXAPRO) 10 MG tablet Take 10 mg by mouth daily    auto-titrating CPAP (AUTOSET) machine By no specified route    cetirizine (ZYRTEC) 10 MG tablet Take 10 mg by mouth as needed for Allergies    Levonorgestrel (MIRENA, 52 MG, IU) by Intrauterine route     No current  facility-administered medications for this visit.      ALLERGIES:     Allergies   Allergen Reactions    Morphine Other (See Comments)     "lots of vomiting"    Seasonal Allergies Other (See Comments)     Watery eyes runny nose and sneezing       ACTIVE PROBLEM LIST:     Patient Active Problem List   Diagnosis Code    Morbid obesity, OSA, pre-DM2, SVT s/p lap RYGB 8/1. 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    Allergic rhinitis J30.9    Excessive daytime sleepiness G47.19    Depression F32.9     PAST MEDICAL HISTORY:     Past Medical History:   Diagnosis Date    Anxiety     Complication of anesthesia     nausea and emesis    Depression     Fatigue     GERD (gastroesophageal reflux disease)     Headache     Morbid obesity     Peripheral edema     during pregnancy    PONV (postoperative nausea and vomiting)     Positive H. pylori titer      treated - 2015    Prediabetes     no meds    Shortness of breath     Sleep apnea     Snoring     Stress incontinence     Supraventricular tachycardia 2011    Hasn't required atenolol in 2 yrs for "racing HR"    Vision abnormalities      Past Surgical History:   Procedure Laterality Date    BREAST REDUCTION SURGERY Bilateral 07/2001    BREAST SURGERY  2003    reduction    COLONOSCOPY      EYE SURGERY Bilateral 2014    tear duct     PR LAP GASTRIC BYPASS/ROUX-EN-Y N/A 10/08/2016    Procedure: LAPAROSCOPIC GASTRIC BYPASS;  Surgeon: Darrell Jewelmalley, William E, MD;  Location: [facility] MAIN OR;  Service: General       FAMILY HISTORY:   There is a known family history of sleep disorders; Mother recently diagnosed    SOCIAL HISTORY:   Marital status: boyfriend  Children: 2; age 41 and 41 years old  Alcohol: yes  occasionally  Recreational drugs: no  Smoking status: non-smoker  Caffeine intake: Occasional soda  Exercise: no  Occupation: Print production plannerffice Manager     REVIEW OF SYSTEMS:   Ten point review of systems obtained and reviewed. See scanned documentation in eRecord for further details.    VITALS:   BP 96/62    Pulse 74    Ht 1.549 m (5\' 1" )    Wt 58.6 kg (129 lb 3.2 oz)    SpO2 97%    BMI 24.41 kg/m     PHYSICAL EXAM:   General: Comfortable appearing and in no acute distress  Eyes: Sclera anicteric, conjunctiva pink  Nasal passages: Patent  Oropharynx: Low set soft palate, narrow airway Modified Mallampati class 3 airway  Maxilla/mandible: Normal   Neck: No lymphadenopathy/thyromegaly  Neck Circumference: 13 inches  Lungs: Clear to auscultation  C-V: Normal heart sounds, no rubs, murmurs, gallops  Musculoskeletal/Extremities: No peripheral edema, no clubbing  Neurologic: Face symmetric. Speech fluent. Normal based gait  Psychologic: Affect Normal    ASSESSMENT:     1. Known history of Obstructive Sleep  Apnea:     Campbell RichesMichelle M Vivian is a 43 y.o. woman with a history of depression and bariatric surgery, who presents today for  evaluation of her obstructive sleep apnea. Her history is suggestive of possible resolution of obstructive sleep apnea with weight reduction.    PLAN:     At this time I recommend obtaining HST to re-evaluate for obstructive sleep apnea severity.  If obstructive sleep apnea is still present then we will set her up for a mask fitting with [employer], if negative we will discontinue CPAP use.     Counseled Marcelino DusterMichelle regarding the pathophysiology and treatment of obstructive sleep apnea and its associated with cardiovascular disease.      I counseled her on the dangers of driving while drowsy and discussed safe driving precautions should she become sleepy while driving in the future.     We plan to have Marcelino DusterMichelle return with concerns. I will MyChart or call the patient with her study results.  She was encouraged to contact our office with any questions or concerns.      Donnamarie RossettiKarleigh J Ellise Kovack, PA  [facility]    Greater than 50% of the visit was spent on counseling/coordination of care as above.

## 2018-03-16 NOTE — Patient Instructions (Signed)
FF [place]:   Home Sleep Test      Overnight sleep studies are typically thought of as taking place in a hospital or sleep clinic laboratory setting. However, a few years ago, new technologies made it possible for sleep studies to be take place in patients' homes.    Thehome sleep study, also known as a home sleep test (HST),has since become a quintessential part of the diagnostic process for identifying the presence of obstructive sleep apnea (OSA).    About75 percent of patients who have beenreferred for a traditional, lab-based sleep study with suspected OSA wind up being good candidates for an HST.    While the HST is not a substitute for the classic overnight attended lab test -- as there are certain sleep disorders it cannot identify -- it is now recognized as a valuable tool to use for people suspected of having OSA but who are otherwise considered healthy.    Most HSTs come as a kit with several parts and instructions for how to use them. The most minimal kit will include:   A smallrecording device, usually a box (about the size of a small cell phone)   Anasal cannula(clear tubing inserted under the nostrils tomeasure airflow)   Astretchy beltto wear across the chest to measure the chest's breathing effort   Apulse oximeter(a finger tip sensor which measures blood oxygen and pulse rate)    Additional devices in an HST kit might include:   Athermistor(a wire sensor that measures oral breathing)   A secondbeltworn across the abdomen to measure the diaphragm's breathing effort   Aposition sensorto indicate what side you are sleeping on at any given time   Asnoring sensorwhich measures throat vibrations which indicate a patient is snoring    Each of these components isapplied prior to bedtime; they usually require simpleadhesion, but nothing painful.    The recording device, once the test is started, collects a variety of data--how many pauses in breathing  occurred, how low the blood oxygen levels dipped throughout the night, any marked changes in heart and respiratory rates.    This data usually provides enough of an objective glimpse into the patient's nighttime breathing patterns to help identify moderate to severe OSA.     If you have any difficulty operating the device, you can call our office for assistance.    You will receive the test and instructions. (In some cases, testsmay even be delivered.) At that short meeting, you (or your caregiver) will be shown how to apply the components of the test kit, which should take less than 10 minutes. Youwill also be given advice for how connect, disconnect, turn on and turn off the recorder. There may even be an opportunity for you to get some hands-on practice.    On the day prior to the evening test, make sure you follow your regular routine, but avoid napping and caffeine after lunch. You may also be asked to temporarily discontinue certain medications by your physician.    On the night of the test, you set up for the test as directed, then go to bed as usual. In the morning, you turn off the device, disconnect and remove the components and return the kit to the clinic (either in person or by prearranged delivery).    In certain cases you may need to run the test for multiple nights in a row. If this is needed it would be specified by your physician.    The data  from the study will be collected, reviewed and scored by a sleep specialist, who will then turn this report over to your sleep physician, who will interpret the results. How long it will take to receive results depends on how soon you return the device and how soon the sleep physician can interpret the reported data. Usually, HST results do not require more than a couple extra days to process when compared to traditional overnight lab studies.

## 2018-04-02 NOTE — Progress Notes (Signed)
[facility]       HOME SLEEP STUDY POST TEST QUESTIONNAIRE      Patient Name:        Brooke Hanson    MRN #  [id number]      DOB:   20-Jul-1975    1. What was your approximate bedtime? _________________________      2.  How long did it take you to fall asleep? _________________________      3. How many times did you get up? _________________________      a. Approximately what time? / How long were you awake?    _____________/_______________    _____________/_______________    4. At what time did you get up for the day? __________________________    5. Any problems or comments?  __________________________  6.  Do you currently use a CPAP machine?   Y  N  7.  Do you use oxygen at night?   Y  N   A)  If yes, did you use it last night?  Y  N  LPM ____  8.  Have you ever had surgery for Sleep Apnea? Y  N   A)  If yes, what was done?  _____________________________________        ___________________________________________________________  9.  Did you wear dental appliance while doing your Home Study?  Y  N                       Please Complete this page  &  Return with Sleep Study Case.Marland Kitchen.Marland Kitchen.Thank You!                 [facility]    HOME SLEEP TESTING EQUIPMENT AGREEMENT FORM    PATIENT NAME:  Brooke Hanson    PATIENT DOB:   20-Jul-1975     MRN #:    [id number]           PHONE NUMBER:   [phone]     ADDRESS:   [address]  [place] [id number]    I have received a Home Sleep Apnea Testing device (MediByte Jr.) from [facility] and have been instructed how to use it. _______    Device Number:______Bedtime_________Sp02______HR______    Direct MD________________________    I have agreed to [address] on  ______________ between 8:00 AM and 10:00 AM.     Anyone can drop-off HSAT device for a patient   To coordinate a later drop-off time please ask your sleep tech       This monitor is the property of [facility].  The value of  this equipment is $2,495.  If you fail to return it by the date stated above, I understand that the Hospitals legal authorities will be notified.  Failure to return this monitor is considered theft of hospital property,      Signed:  ________________________   Date: _________      Witness:  _______________________   Date: _________                  [facility]                 [facility]    Patient name: Brooke Hanson  20-Jul-1975  MRN # :  [id number]      I have returned the MEDIBYTE Jr Portable PSG Monitor,  Device Number:______  belonging to the [facility] located at [address] [place], [place]    ________________________________________________________________        (DATE)    (TIME)    _____________________________    Patient signature    _____________________________    [facility] Staff          Please do not smoke near or while wearing the Home Sleep Testing Equipment    Please do not allow pets on or near the [place]    In the event of an Emergency Dial 911 immediately

## 2018-04-08 ENCOUNTER — Ambulatory Visit: Payer: No Typology Code available for payment source | Attending: Sleep Medicine | Admitting: Sleep Medicine

## 2018-04-08 DIAGNOSIS — G4733 Obstructive sleep apnea (adult) (pediatric): Secondary | ICD-10-CM

## 2018-04-09 ENCOUNTER — Telehealth: Payer: Self-pay

## 2018-04-09 NOTE — Telephone Encounter (Signed)
Sent MyChart msg to pt letting her know the study recorded adequately

## 2018-04-12 NOTE — Procedures (Signed)
Unattended HST Summary Report  Patient Name: Brooke Hanson, Brooke Hanson Patient ID: [id number]   Date of Birth: 10/20/1975 ESS: 13/24   Weight: 129.0 lbs Study Date: 04/08/18   Height: 5 1 155 cm Age: 43   BMI: 24.3 Neck Circumference: 13 Sex:  Female   Scoring: Malotte Visit #:[id number] Primary Care: Ples Specter Referring Physician: Dyanne Carrel   Comments: *Scored using AASM criteria      Total Recording Time(TRT):  494.0 minutes Cardiac   Respiratory and Snoring Events Total# Index Duration (sec.) Avg HR: 69.0 Min HR: 55.0 Max HR: 99.0      Mean Min Max         Central Apneas 1 0.1 14.5 14.5 14.5 Oximetry   Obstructive.Apneas 18 2.2 12.3 10.0 19.0 Mean SpO2 95.6%   Mixed Apneas 0 0.0 0.0 0.0 0.0 Min SpO2 91.0%   Hypopneas 40 4.9 29.2 10.7 91.2 Max SpO2 99.0%   Apnea + Hypopnea 59 7.2 23.8 10.0 91.2 SpO2 Range % Minutes         90-100 % 100.0% 494.0         80-89 % 0.0% 0.0   Desaturations 56 6.8 102.3 14.4 296.1 70-79 % 0.0% 0.0         60-69% 0.0% 0.0   Snoring 6122 743.5 1.4 0.2 4.6 50-59% 0.0% 0.0         < 50 % 0.0% 0.2      Body Position Supine Prone Left Side Right Side Total Non Supine   % Time in Position 9.4% 0.0% 64.8% 25.6% 90.5%   Snoring events 659 0 3893 1558 5451   Apnea + Hypopnea events 16 0 25 18 43   Apnea + Hypopnea Index 20.7 0.0 4.7 8.5 5.8       SPO2    PULSE    RESPIRATORY   #ERR#   BODYPOS    BAD         Total Recording Time: 494.0 minutes   Patient Unattended Apnea Hypopnea Index       Richardo Hanks 7.2       *Hypopneas were defined as at least a 30% reduction in air flow lasting at least 10 seconds and associated with either a 3% (AASM) or 4% (CMS) criteria for oxygen desaturation.       Campbell Riches underwent a type III home sleep apnea test. Type III home sleep apnea tests include nasal flow monitors, pulse oximetry, snore microphone, respiratory effort sensors, and a body position sensor.  There is no EEG monitoring during this study, as such we are not able to discern between  periods of sleep and wake. Therefore, home sleep testing tends to underestimate the actual severity of sleep disordered breathing.      The home sleep test was consistent with a diagnosis of mild obstructive sleep apnea (unattended AHI 7, oxygen saturation nadir 91%).  These findings may under estimate the true severity of sleep disordered breathing due to the limitations of home sleep apnea testing.  The study quality was good.      This study was electronically signed by Myrtis Hopping, MD on 04/12/2018 at 4:50 PM.

## 2018-04-13 ENCOUNTER — Telehealth: Payer: Self-pay

## 2018-04-13 ENCOUNTER — Other Ambulatory Visit: Payer: Self-pay | Admitting: Physician Assistant

## 2018-04-13 ENCOUNTER — Encounter: Payer: Self-pay | Admitting: Gastroenterology

## 2018-04-13 ENCOUNTER — Encounter: Payer: Self-pay | Admitting: Physician Assistant

## 2018-04-13 DIAGNOSIS — G4730 Sleep apnea, unspecified: Secondary | ICD-10-CM

## 2018-04-13 MED ORDER — GENERIC DME *A*
0 refills | Status: AC
Start: 2018-04-13 — End: ?

## 2018-04-13 NOTE — Progress Notes (Signed)
Mask fitting order written; she will be out of town until 04/20/18. Thanks, Donnamarie Rossetti, PA

## 2018-04-13 NOTE — Telephone Encounter (Signed)
Hi Brooke Hanson, just a remonder you are my charting her results TY!

## 2018-04-13 NOTE — Progress Notes (Signed)
Script for mask fitting emailed to [employer].

## 2018-04-26 ENCOUNTER — Telehealth: Payer: Self-pay | Admitting: Physician Assistant

## 2018-04-26 NOTE — Telephone Encounter (Signed)
Message from DME [employer]:     "I called this pt for a mask fitting appt; She said she just got back from a vacation and can't take time off to come in during work hrs. She also has a high deductible so she would be paying basically for it. She wanted to try nasal pillows.  I offered to send some out but she didn't want to pay for them. I told her she would have a 30day period for an exchange if she didn't like them but she said she would just continue to use her nasal mask.  Thank you"     Eugenio Hoes RT [facility]  [facility]

## 2018-09-17 LAB — UNMAPPED LAB RESULTS
Hematocrit (HT): 41.4 % — NL (ref 34.0–47.0)
Hemoglobin (HGB) (HT): 13.7 g/dL — NL (ref 11.5–16.0)
MCHC (HT): 33.1 g/dL — NL (ref 32.0–36.0)
MCV (HT): 97.9 FL — NL (ref 81.0–99.0)
Mean Corpuscular Hemoglobin (MCH) (HT): 32.4 pg — NL (ref 26.0–34.0)
Platelets (HT): 250 10 3/uL — NL (ref 140–400)
RBC (HT): 4.23 10 6/uL — NL (ref 3.80–5.20)
RDW (HT): 11.3 % — ABNORMAL LOW (ref 11.5–15.0)
WBC (HT): 10.5 10 3/uL — NL (ref 4.0–10.8)

## 2018-10-08 ENCOUNTER — Other Ambulatory Visit
Admission: RE | Admit: 2018-10-08 | Discharge: 2018-10-08 | Disposition: A | Discharge status: Home / Self Care | Payer: No Typology Code available for payment source | Source: Ambulatory Visit

## 2018-10-08 ENCOUNTER — Ambulatory Visit: Payer: No Typology Code available for payment source

## 2018-10-08 DIAGNOSIS — Z1159 Encounter for screening for other viral diseases: Secondary | ICD-10-CM | POA: Insufficient documentation

## 2018-10-08 DIAGNOSIS — Z20828 Contact with and (suspected) exposure to other viral communicable diseases: Secondary | ICD-10-CM | POA: Insufficient documentation

## 2018-10-09 LAB — COVID-19 NAAT (PCR): COVID-19 NAAT (PCR): 0

## 2018-10-18 ENCOUNTER — Ambulatory Visit: Payer: No Typology Code available for payment source | Attending: Bariatrics | Admitting: Bariatrics

## 2018-10-18 ENCOUNTER — Encounter: Payer: Self-pay | Admitting: Bariatrics

## 2018-10-18 VITALS — BP 126/64 | HR 81 | Temp 99.0°F | Resp 18 | Ht 60.98 in | Wt 143.6 lb

## 2018-10-18 DIAGNOSIS — K909 Intestinal malabsorption, unspecified: Secondary | ICD-10-CM | POA: Insufficient documentation

## 2018-10-18 DIAGNOSIS — Z9884 Bariatric surgery status: Secondary | ICD-10-CM | POA: Insufficient documentation

## 2018-10-18 DIAGNOSIS — Z6827 Body mass index (BMI) 27.0-27.9, adult: Secondary | ICD-10-CM | POA: Insufficient documentation

## 2018-10-18 LAB — COMPREHENSIVE METABOLIC PANEL
ALT: 20 U/L (ref 0–35)
AST: 21 U/L (ref 0–35)
Albumin: 3.5 g/dL (ref 3.5–5.2)
Alk Phos: 57 U/L (ref 35–105)
Anion Gap: 8 (ref 7–16)
Bilirubin,Total: 0.5 mg/dL (ref 0.0–1.2)
CO2: 28 mmol/L (ref 20–28)
Calcium: 8.5 mg/dL — ABNORMAL LOW (ref 8.8–10.2)
Chloride: 102 mmol/L (ref 96–108)
Creatinine: 0.68 mg/dL (ref 0.51–0.95)
GFR,Black: 126 *
GFR,Caucasian: 109 *
Glucose: 93 mg/dL (ref 60–99)
Lab: 14 mg/dL (ref 6–20)
Potassium: 4.4 mmol/L (ref 3.3–5.1)
Sodium: 138 mmol/L (ref 133–145)
Total Protein: 5.9 g/dL — ABNORMAL LOW (ref 6.3–7.7)

## 2018-10-18 LAB — FOLATE: Folate: 10.3 ng/mL (ref >=4.6)

## 2018-10-18 LAB — CBC
Hematocrit: 30 % — ABNORMAL LOW (ref 34–45)
Hemoglobin: 9.6 g/dL — ABNORMAL LOW (ref 11.2–15.7)
MCH: 33 pg — ABNORMAL HIGH (ref 26–32)
MCHC: 32 g/dL (ref 32–36)
MCV: 104 fL — ABNORMAL HIGH (ref 79–95)
Platelets: 376 10*3/uL — ABNORMAL HIGH (ref 160–370)
RBC: 2.9 MIL/uL — ABNORMAL LOW (ref 3.9–5.2)
RDW: 12.2 % (ref 11.7–14.4)
WBC: 9.6 10*3/uL (ref 4.0–10.0)

## 2018-10-18 LAB — VITAMIN D: 25-OH Vit Total: 77 ng/mL — ABNORMAL HIGH (ref 30–60)

## 2018-10-18 LAB — FERRITIN: Ferritin: 415 ng/mL — ABNORMAL HIGH (ref 10–120)

## 2018-10-18 LAB — VITAMIN B12: Vitamin B12: 1271 pg/mL — ABNORMAL HIGH (ref 232–1245)

## 2018-10-18 LAB — PTH, INTACT: Intact PTH: 52.8 pg/mL (ref 15.0–65.0)

## 2018-10-18 LAB — IRON: Iron: 87 ug/dL (ref 34–165)

## 2018-10-18 NOTE — Progress Notes (Signed)
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 Gershon Mussel'Malley, M.D., F.A.C.S.       Bariatric Vitals:  Vitals 10/18/2018 03/16/2018 10/20/2017 10/20/2017 03/06/2017   Preop Weight 236 lb - - 236 lb 236 lb   Weight 143 lb 9.6 oz 129 lb 3.2 oz 125 lb 3.2 oz 125 lb 3.2 oz 168 lb   BAR Weight (lbs) 143.6 - 125.2 - -   Height 5' 0.984" 5\' 1"  5' 0.984" - 5' 0.984"   BAR Height (in) 60.98 - 60.98 - -   BMI (Calculated) 27.2 kg/m2 24.5 kg/m2 23.7 kg/m2 - 31.8 kg/m2   BAR BMI (Calculated) 27.2 kg/m2 - 23.7 kg/m2 - -   IBW in kg (Bariatric) 47.59 47.63 47.59 - 47.59   IBW in lbs (Bariatric) 104.92 105 104.92 - 104.92   Percent Excess Weight Loss -70.49 % -123.01 % -84.54 % - -51.88 %   Weight Change for Visit (kg) 6.53 58.59 0 -19.41 -8.71   Weight Change Total (kg) -41.9 58.59 -50.25 -50.25 -30.84   Initial Excess Weight 59.44 -47.63 59.44 - 59.44   Total Weight Loss Percent -39.15 % 2222 % -46.95 % -46.95 % -28.81 %     Vitals 10/18/2018 03/16/2018 10/20/2017 03/06/2017 01/06/2017   BP 126/64 96/62 111/67 121/68 131/83   Pulse 81 74 84 76 85   Resp 18 - - 18 18   Temp 99 - 97.2 97.5 96.6       ACS Information:  ACS 10/18/2018 10/18/2018 10/08/2018 04/13/2018 04/08/2018   Weight Change Since Preop -41.9 - - - -   DIAGNOSES THIS VISIT - BMI 27.0-27.9,adult  Bariatric surgery status - Sleep apnea, unspecified type Obstructive sleep apnea   TREATMENTS - - - - -       Subjective:  Patient returns for a standard post operative evaluation. A review of pertinent symptoms reveals:  Brooke Hanson complains of abdominal pain  Pertinent negatives include      Co-Morbid Conditions:  These comorbid conditions existed prior to surgery: obstructive sleep apnea, depression, anxiety, GERD   .    Patient does have sleep apnea and is compliant with using CPAP  Patient does not have diabetes.   Patient does not have hypertension treated with medication.  Patient does not have GERD treated with PPI or H2  Patient does not have hyperlipidemia treated with medication    Complications:  Patient was not diagnosed with venous thrombosis postoperatively.  Patient was not diagnosed with a pulmonary emobolism postoperatively.  Patient does not have a new incisional hernia    Current Medications, Vitamins, and Supplements list:  Medications/Vitamins/Supplements         Last Dose Start Date End Date Provider     auto-titrating CPAP (AUTOSET) machine Taking  --  --  [provider]     By no specified route     biotin 5 MG tablet Taking  --  --  [provider]     Take 5 mg by mouth daily     calcium citrate-vitamin D (CALCIUM CITRATE + D) 315-250 MG-UNIT per tablet Taking  --  --  [provider]  Take 2 tablets by mouth 2 times daily     cetirizine (ZYRTEC) 10 MG tablet Taking  --  --  [provider]     Take 10 mg by mouth as needed for Allergies     cyanocobalamin (VITAMIN B-12) 1000 MCG tablet Taking  --  --  [provider]     Take 1,000 mcg by mouth daily     escitalopram (LEXAPRO) 10 MG tablet Taking  --  --  [provider]     Take 10 mg by mouth daily     ferrous sulfate 325 (65 FE) MG tablet Taking  --  --  [provider]     Take 325 mg by mouth daily (with breakfast)     generic DME Taking  04/13/18  --  Kelton Pillar, PA     Perform mask fitting & provide all needed PAP replacement supplies incl. heated humidifier & climate line tubing. Duration: Lifetime  She is available after 04/20/18 for fitting     Levonorgestrel (MIRENA, 52 MG, IU) Taking  --  --  [provider]     by Intrauterine route     multi-vitamin (MULTIVITAMIN) per tablet Not Taking  --  --  [provider]     Take 1 tablet by mouth daily      thiamine 50 MG tablet Taking  --  --  [provider]     Take 50 mg by mouth daily          Social History:  Brooke Hanson  reports that she quit smoking about 21 years ago. She has never used smokeless tobacco.   She  reports no history of alcohol use.  Brooke Hanson reports she does not use NSAIDS.  She does not attend support meetings.  Brooke Hanson does not exercise.gym closed with the pandemic    Diet History:  Brooke Hanson does adhere to the current meal plan.  Brooke Hanson is not using protein supplements.  She does use caffeine.diet coke but has stopped with recent surgery    Physical Exam:  Physical Exam   Vitals reviewed.  Constitutional: She is oriented to person, place, and time. She appears well-developed.   HENT:   Head: Normocephalic.   Neck: Normal range of motion.   Cardiovascular: Normal rate and regular rhythm.   Pulmonary/Chest: Effort normal and breath sounds normal.   Abdominal: Soft. Bowel sounds are normal.   3 JP drains in   Musculoskeletal: Normal range of motion.   Neurological: She is alert and oriented to person, place, and time.   Skin: Skin is warm and dry.   Abdominal incisions not examined         Assessment:  pain from drains   Post op course has been uneventful.  Weight loss has been above average.  She is compliant with diet.       Plan:  Brooke Hanson was educated on Diet: lifestyle  Exercise: Begin exercise.     No orders of the defined types were placed in this encounter.      Return in about 1 year (around 10/18/2019).

## 2018-10-18 NOTE — Patient Instructions (Signed)
Continue supplements     bariatric information handout given

## 2018-10-18 NOTE — Addendum Note (Signed)
Addended by: Greta Doom on: 10/18/2018 09:50 AM     Modules accepted: Orders

## 2018-10-19 ENCOUNTER — Other Ambulatory Visit: Payer: Self-pay | Admitting: Bariatrics

## 2018-10-19 ENCOUNTER — Encounter: Payer: Self-pay | Admitting: Bariatrics

## 2018-10-19 DIAGNOSIS — K909 Intestinal malabsorption, unspecified: Secondary | ICD-10-CM

## 2018-10-26 ENCOUNTER — Ambulatory Visit: Payer: No Typology Code available for payment source | Admitting: Surgery

## 2018-10-26 ENCOUNTER — Ambulatory Visit: Payer: No Typology Code available for payment source | Admitting: Bariatrics

## 2019-01-04 ENCOUNTER — Ambulatory Visit
Admission: AD | Admit: 2019-01-04 | Discharge: 2019-01-04 | Disposition: A | Discharge status: Home / Self Care | Payer: No Typology Code available for payment source | Source: Ambulatory Visit | Attending: Physician Assistant | Admitting: Physician Assistant

## 2019-01-04 DIAGNOSIS — Z20828 Contact with and (suspected) exposure to other viral communicable diseases: Secondary | ICD-10-CM | POA: Insufficient documentation

## 2019-01-04 DIAGNOSIS — J069 Acute upper respiratory infection, unspecified: Secondary | ICD-10-CM | POA: Insufficient documentation

## 2019-01-04 DIAGNOSIS — Z7189 Other specified counseling: Secondary | ICD-10-CM | POA: Insufficient documentation

## 2019-01-04 LAB — POCT AMBULATORY RAPID STREP
Lot #: 201044
Rapid Strep Group A Throat-POC: NEGATIVE

## 2019-01-04 LAB — HM HIV SCREENING OFFERED

## 2019-01-04 NOTE — ED Triage Notes (Signed)
Last night began having sore throat. It began getting worse this morning. Says she has nasal congestion.       Triage Note   Randa Lynn, LPN

## 2019-01-04 NOTE — UC Provider Note (Signed)
History     Chief Complaint   Patient presents with    Sore Throat     Patient is a 43 year old female with extensive past medical history including diabetes, GERD and obesity status post gastric bypass surgery presenting to urgent care with a chief complaint of chills, sore throat, nasal congestion and headache.  Symptoms began last night.  She describes sore throat as feeling like she is swallowing glass.  Sore throat has improved throughout the day.  She is not treating symptoms in any way.  She states she would typically not seek medical care for symptoms but is concerned she may have strep or COVID-19 and was around elderly family members the past few days. She spoke to PCP office who recommended [facility] evaluation. She denies sick contacts and known exposure to COVID-19.  She denies recent travel. She denies fever, myalgia,cough, shortness of breath, N/V/D.         Medical/Surgical/Family History     Past Medical History:   Diagnosis Date    Anxiety     Complication of anesthesia     nausea and emesis    Depression     Fatigue     GERD (gastroesophageal reflux disease)     Headache     Morbid obesity     Peripheral edema     during pregnancy    PONV (postoperative nausea and vomiting)     Positive H. pylori titer     treated - 2015    Prediabetes     no meds    Shortness of breath     Sleep apnea     Snoring     Stress incontinence     Supraventricular tachycardia 2011    Hasn't required atenolol in 2 yrs for "racing HR"    Vision abnormalities         Patient Active Problem List   Diagnosis Code    Morbid obesity, OSA, pre-DM2, SVT s/p lap RYGB 8/1. 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    Allergic rhinitis J30.9    Excessive daytime sleepiness G47.19    Depression F32.9    BMI 27.0-27.9,adult Z68.27            Past Surgical History:   Procedure Laterality Date    BREAST REDUCTION SURGERY Bilateral 07/2001     BREAST SURGERY  2003    reduction    COLONOSCOPY      EYE SURGERY Bilateral 2014    tear duct     PR LAP GASTRIC BYPASS/ROUX-EN-Y N/A 10/08/2016    Procedure: LAPAROSCOPIC GASTRIC BYPASS;  Surgeon: Darrell Jewelmalley, William E, MD;  Location: [facility] MAIN OR;  Service: General     Family History   Problem Relation Age of Onset    High Blood Pressure Mother     Anesthesia problems Mother     Arthritis Mother     Obesity Mother     High cholesterol Mother     Aneurysm Paternal Grandfather         heart    Heart Disease Paternal Grandfather     Morbid Obesity Paternal Grandfather     Arthritis Paternal Grandfather     Early death Paternal Grandfather     High Blood Pressure Paternal Grandfather     Arthritis Maternal Grandfather     COPD Maternal Grandfather     Depression Maternal Grandfather  Stroke Maternal Grandfather     Asthma Maternal Grandfather     High Blood Pressure Maternal Grandmother     Arthritis Maternal Grandmother     Cancer Paternal Grandmother 69        breast    Morbid Obesity Paternal Grandmother     Arthritis Paternal Grandmother     Mental illness Father           Social History     Tobacco Use    Smoking status: Former Smoker     Quit date: 02/07/1997     Years since quitting: 21.9    Smokeless tobacco: Never Used   Substance Use Topics    Alcohol use: No     Comment: rarely once every other month    Drug use: No     Living Situation     Questions Responses    Patient lives with     Homeless     Caregiver for other family member     External Services     Employment     Domestic Violence Risk                 Review of Systems   Review of Systems   Constitutional: Positive for chills. Negative for activity change, fatigue and fever.   HENT: Positive for congestion and sore throat. Negative for ear pain.    Respiratory: Negative for cough, chest tightness, shortness of breath and wheezing.    Gastrointestinal: Negative for diarrhea, nausea and vomiting.   Musculoskeletal:  Negative for myalgias.   Neurological: Positive for headaches.       Physical Exam   Triage Vitals      First Recorded BP: 118/80, Resp: 15, Temp: 37 C (98.6 F), Temp src: Oral Oxygen Therapy SpO2: 98 %, Oximetry Source: Rt Hand, O2 Device: None (Room air), Heart Rate: 78, (01/04/19 1945)  .      Physical Exam  Vitals signs and nursing note reviewed.   Constitutional:       General: She is not in acute distress.     Appearance: She is well-developed. She is not ill-appearing, toxic-appearing or diaphoretic.   HENT:      Head: Normocephalic and atraumatic.      Right Ear: Tympanic membrane, ear canal and external ear normal.      Left Ear: Tympanic membrane, ear canal and external ear normal.      Nose: Congestion present.      Right Sinus: No maxillary sinus tenderness or frontal sinus tenderness.      Left Sinus: Maxillary sinus tenderness present. No frontal sinus tenderness.      Mouth/Throat:      Pharynx: Uvula midline. Posterior oropharyngeal erythema present. No oropharyngeal exudate or uvula swelling.      Tonsils: No tonsillar exudate or tonsillar abscesses. 1+ on the right. 1+ on the left.   Eyes:      Conjunctiva/sclera: Conjunctivae normal.   Cardiovascular:      Rate and Rhythm: Normal rate and regular rhythm.   Pulmonary:      Effort: Pulmonary effort is normal. No respiratory distress.      Breath sounds: Normal breath sounds. No stridor. No wheezing, rhonchi or rales.   Neurological:      Mental Status: She is alert and oriented to person, place, and time.          Medical Decision Making      Labs Reviewed   STREP A CULTURE, THROAT  COVID-19 PCR   POCT AMBULATORY RAPID STREP     Rapid strep negative   Initial Evaluation:  ED First Provider Contact     Date/Time Event User Comments    01/04/19 1716 ED First Provider Contact Hassell Done, Kathelene Rumberger P Initial Face to Face Provider Contact          Patient was seen on: 01/04/2019    Assessment:  43 y.o.female comes to the Urgent [place] with chills,  headache, nasal congestion and sore throat     Differential Diagnosis includes:    Viral URI  Sinusitis   Strep pharyngitis   Viral illness   COVID19    Plan:   You have been tested for COVID19. Self quarantine yourself until you are called with the results. Treat illness symptomatically     Rapid strep is negative. You will be called if culture is positive and you need to be started on an antibiotic     - lozenges, chloraseptic spray for sore throat, soothing drinks or foods (ex. Warm tea with honey, popsickle   - saline or flonase nasal spray for nasal congestion  - over the counter allergy medication like zyrtec or Claritin  - tylenol as needed   - over the counter decongestant     Follow up with PCP for persistent symptoms     IF at any point you have difficulty swallowing, drooling, fever not responding to medication, shortness of breath, or other worsening symptoms you should go to the ER       Questions were addressed prior to discharge, treatment plan is agreed upon at this time.    Final Diagnosis  Final diagnoses:   [J06.9] Viral URI (Primary)   [Z71.89] Advice given about 2019 novel coronavirus infection       Judge Stall, [place]       I have reviewed nursing documentation, confirmed information with patient, and revised with nursing as necessary.       Vernard Gambles [place], [place]  01/04/19 [phone]

## 2019-01-04 NOTE — Discharge Instructions (Addendum)
You have been tested for [place]. Self quarantine yourself until you are called with the results. Treat illness symptomatically     Rapid strep is negative. You will be called if culture is positive and you need to be started on an antibiotic     - lozenges, chloraseptic spray for sore throat, soothing drinks or foods (ex. Warm tea with honey, popsickle   - saline or flonase nasal spray for nasal congestion  - over the counter allergy medication like zyrtec or Claritin  - tylenol as needed   - over the counter decongestant     Follow up with PCP for persistent symptoms     IF at any point you have difficulty swallowing, drooling, fever not responding to medication, shortness of breath, or other worsening symptoms you should go to the ER

## 2019-01-05 ENCOUNTER — Telehealth: Payer: Self-pay

## 2019-01-05 LAB — COVID-19 PCR

## 2019-01-05 LAB — COVID-19 NAAT (PCR): COVID-19 NAAT (PCR): NEGATIVE

## 2019-01-05 NOTE — Telephone Encounter (Signed)
-----   Message from Susan Sharza, MD sent at 01/05/2019  5:12 PM EDT -----  No change in treatment indicated based upon review of lab report and clinical note.  Contact to patient needed regarding negative test. Test is not 100 % accurate so use illness symptoms to guide isolation and further medical care.    Follow up with pcp for persistent illness or concerns.

## 2019-01-05 NOTE — Telephone Encounter (Signed)
Called patient and informed them of negative Covid swab result. Patient verbalized understanding of information given.

## 2019-01-06 LAB — STREP A CULTURE, THROAT: Group A Strep Throat Culture: 0

## 2019-03-02 ENCOUNTER — Other Ambulatory Visit: Payer: Self-pay | Admitting: Obstetrics and Gynecology

## 2019-03-02 DIAGNOSIS — Z1231 Encounter for screening mammogram for malignant neoplasm of breast: Secondary | ICD-10-CM

## 2019-03-15 ENCOUNTER — Other Ambulatory Visit: Payer: Self-pay | Admitting: Obstetrics and Gynecology

## 2019-03-15 DIAGNOSIS — Z1231 Encounter for screening mammogram for malignant neoplasm of breast: Secondary | ICD-10-CM

## 2019-03-17 ENCOUNTER — Ambulatory Visit
Admission: RE | Admit: 2019-03-17 | Discharge: 2019-03-17 | Disposition: A | Discharge status: Home / Self Care | Payer: No Typology Code available for payment source | Source: Ambulatory Visit | Attending: Obstetrics and Gynecology | Admitting: Obstetrics and Gynecology

## 2019-03-17 ENCOUNTER — Other Ambulatory Visit
Admission: RE | Admit: 2019-03-17 | Discharge: 2019-03-17 | Disposition: A | Discharge status: Home / Self Care | Payer: No Typology Code available for payment source | Source: Ambulatory Visit | Attending: Obstetrics and Gynecology | Admitting: Obstetrics and Gynecology

## 2019-03-17 ENCOUNTER — Other Ambulatory Visit: Payer: Self-pay | Admitting: Radiology

## 2019-03-17 DIAGNOSIS — Z01419 Encounter for gynecological examination (general) (routine) without abnormal findings: Secondary | ICD-10-CM | POA: Insufficient documentation

## 2019-03-17 DIAGNOSIS — R928 Other abnormal and inconclusive findings on diagnostic imaging of breast: Secondary | ICD-10-CM

## 2019-03-17 DIAGNOSIS — Z1231 Encounter for screening mammogram for malignant neoplasm of breast: Secondary | ICD-10-CM | POA: Insufficient documentation

## 2019-03-18 ENCOUNTER — Ambulatory Visit
Admission: RE | Admit: 2019-03-18 | Discharge: 2019-03-18 | Disposition: A | Discharge status: Home / Self Care | Payer: No Typology Code available for payment source | Source: Ambulatory Visit | Attending: Radiology | Admitting: Radiology

## 2019-03-18 ENCOUNTER — Ambulatory Visit
Admission: RE | Admit: 2019-03-18 | Discharge: 2019-03-18 | Disposition: A | Discharge status: Home / Self Care | Payer: No Typology Code available for payment source | Source: Ambulatory Visit

## 2019-03-18 DIAGNOSIS — N6012 Diffuse cystic mastopathy of left breast: Secondary | ICD-10-CM

## 2019-03-18 DIAGNOSIS — Z1231 Encounter for screening mammogram for malignant neoplasm of breast: Secondary | ICD-10-CM | POA: Insufficient documentation

## 2019-03-18 DIAGNOSIS — N6323 Unspecified lump in the left breast, lower outer quadrant: Secondary | ICD-10-CM | POA: Insufficient documentation

## 2019-03-18 DIAGNOSIS — R928 Other abnormal and inconclusive findings on diagnostic imaging of breast: Secondary | ICD-10-CM

## 2019-03-22 ENCOUNTER — Other Ambulatory Visit: Payer: Self-pay | Admitting: Obstetrics and Gynecology

## 2019-03-22 DIAGNOSIS — N6320 Unspecified lump in the left breast, unspecified quadrant: Secondary | ICD-10-CM

## 2019-03-24 LAB — GYN CYTOLOGY

## 2019-06-02 LAB — UNMAPPED LAB RESULTS
Basophil # (HT): 0 10 3/uL — NL (ref 0.0–0.2)
Basophil % (HT): 0 % — NL (ref 0–2)
Eosinophil # (HT): 0.2 10 3/uL — NL (ref 0.0–0.5)
Eosinophil % (HT): 2 % — NL (ref 0–7)
Hematocrit (HT): 41 % — NL (ref 34–47)
Hemoglobin (HGB) (HT): 13.5 g/dL — NL (ref 11.5–16.0)
Lymphocyte # (HT): 3.4 10 3/uL — NL (ref 0.9–3.8)
Lymphocyte % (HT): 31 % — NL (ref 17–44)
MCHC (HT): 33.2 g/dL — NL (ref 32.0–36.0)
MCV (HT): 95.1 fL — NL (ref 81.0–99.0)
Mean Corpuscular Hemoglobin (MCH) (HT): 31.5 pg — NL (ref 26.0–34.0)
Monocyte # (HT): 0.9 10 3/uL — NL (ref 0.2–1.0)
Monocyte % (HT): 8 % — NL (ref 4–12)
Neutrophil # (HT): 6.2 10 3/uL — NL (ref 1.5–7.7)
Platelets (HT): 268 10 3/uL — NL (ref 140–400)
RBC (HT): 4.28 10 6/uL — NL (ref 3.80–5.20)
RDW (HT): 11.3 % — ABNORMAL LOW (ref 11.5–15.0)
Seg Neut % (HT): 58 % — NL (ref 40–75)
WBC (HT): 10.8 10 3/uL — NL (ref 4.0–10.8)

## 2019-07-11 ENCOUNTER — Ambulatory Visit
Admission: AD | Admit: 2019-07-11 | Discharge: 2019-07-11 | Disposition: A | Discharge status: Home / Self Care | Payer: No Typology Code available for payment source | Source: Ambulatory Visit | Attending: Family | Admitting: Family

## 2019-07-11 DIAGNOSIS — Z20822 Contact with and (suspected) exposure to covid-19: Secondary | ICD-10-CM | POA: Insufficient documentation

## 2019-07-11 DIAGNOSIS — Z7189 Other specified counseling: Secondary | ICD-10-CM | POA: Insufficient documentation

## 2019-07-11 DIAGNOSIS — M436 Torticollis: Secondary | ICD-10-CM

## 2019-07-11 DIAGNOSIS — J029 Acute pharyngitis, unspecified: Secondary | ICD-10-CM | POA: Insufficient documentation

## 2019-07-11 LAB — COVID-19 NAAT (PCR): COVID-19 NAAT (PCR): NEGATIVE

## 2019-07-11 LAB — COVID-19 PCR

## 2019-07-11 MED ORDER — CYCLOBENZAPRINE HCL 5 MG PO TABS *I*
10.0000 mg | ORAL_TABLET | Freq: Three times a day (TID) | ORAL | 0 refills | Status: AC | PRN
Start: 2019-07-11 — End: ?

## 2019-07-11 NOTE — ED Triage Notes (Addendum)
Pt states since Friday morning she has been experiencing neck pain.  Describes it as stiff and painful if she moves it.  Woke up this morning with a sore throat that she thinks is d/t mouth breathing the past few nights.  Has taken Tylenol and used a Hemp muscle cream with no relief. Went to chiropractor this morning who told her that the right side of neck is hot to the touch and lymph node felt swollen.         Triage Note   Purcell Nails, LPN

## 2019-07-11 NOTE — UC Provider Note (Signed)
History   No chief complaint on file.    This is a 44yo female with a PMH of anxiety, GERD, prediabetes presenting to urgent care with pain and stiffness to the right lateral neck x 4-days. 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 She had no pain originally but developed pain on Friday morning which worsened on Saturday. She saw a chiropractor this morning who reported that area was warm to touch and enflamed. They were unable to provide any adjustments due to this. She also has a dry, sore throat and swallowing aggravates the pain in her neck. She has pain with swallowing but no dysphagia. She reports that throat feels like she slept with her mouth open - she does not believe this is the case because she sleeps with a CPAP machine at night. She denies having a cough, congestion, fevers, chills or concerns for covid-19.       History provided by:  Patient  Language interpreter used: No        Medical/Surgical/Family History     Past Medical History:   Diagnosis Date    Anxiety     Complication of anesthesia     nausea and emesis    Depression     Fatigue     GERD (gastroesophageal reflux disease)     Headache     Morbid obesity     Peripheral edema     during pregnancy    PONV (postoperative nausea and vomiting)     Positive H. pylori titer     treated - 2015    Prediabetes     no meds    Shortness of breath     Sleep apnea     Snoring     Stress incontinence     Supraventricular tachycardia 2011    Hasn't required atenolol in 2 yrs for "racing HR"    Vision abnormalities         Patient Active Problem List   Diagnosis Code    Morbid obesity, OSA, pre-DM2, SVT s/p lap RYGB 8/1. 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    Allergic rhinitis J30.9    Excessive daytime sleepiness G47.19    Depression F32.9    BMI 27.0-27.9,adult Z68.27            Past Surgical  History:   Procedure Laterality Date    BREAST REDUCTION SURGERY Bilateral 07/2001    BREAST SURGERY  2003    reduction    COLONOSCOPY      EYE SURGERY Bilateral 2014    tear duct     PR LAP GASTRIC BYPASS/ROUX-EN-Y N/A 10/08/2016    Procedure: LAPAROSCOPIC GASTRIC BYPASS;  Surgeon: Darrell Jewel, MD;  Location: [facility]IN [facility];  Service: General     Family History   Problem Relation Age of Onset    High Blood Pressure Mother     Anesthesia problems Mother     Arthritis Mother     Obesity Mother     High cholesterol Mother     Aneurysm Paternal Grandfather         heart    Heart Disease Paternal Grandfather     Morbid Obesity Paternal Grandfather     Arthritis Paternal Grandfather     Early death Paternal Grandfather     High Blood Pressure Paternal  Grandfather     Arthritis Maternal Grandfather     COPD Maternal Grandfather     Depression Maternal Grandfather     Stroke Maternal Grandfather     Asthma Maternal Grandfather     High Blood Pressure Maternal Grandmother     Arthritis Maternal Grandmother     Cancer Paternal Grandmother 35        breast    Morbid Obesity Paternal Grandmother     Arthritis Paternal Grandmother     Breast cancer Paternal Grandmother 58    Mental illness Father     Ovarian cancer Neg Hx           Social History     Tobacco Use    Smoking status: Former Smoker     Quit date: 02/07/1997     Years since quitting: 22.4    Smokeless tobacco: Never Used   Substance Use Topics    Alcohol use: No     Comment: rarely once every other month    Drug use: Yes     Types: Marijuana     Living Situation     Questions Responses    Patient lives with Child    Homeless     Caregiver for other family member     External Services     Employment     Domestic Violence Risk                 Review of Systems   Review of Systems   Constitutional: Negative for chills and fever.   HENT: Positive for sore throat. Negative for congestion and trouble swallowing.    Respiratory: Negative  for cough and shortness of breath.    Cardiovascular: Negative for chest pain.   Gastrointestinal: Negative for diarrhea, nausea and vomiting.   Musculoskeletal: Positive for neck pain (right lateral) and neck stiffness (right lateral neck). Negative for gait problem.   Skin: Negative for rash and wound.   Allergic/Immunologic: Negative for immunocompromised state.   Neurological: Negative for dizziness, weakness, light-headedness, numbness and headaches.       Physical Exam   Triage Vitals      First Recorded BP: 128/69, Resp: 14, Temp: 36.8 C (98.2 F), Temp src: TEMPORAL Oxygen Therapy SpO2: 96 %, Oximetry Source: Lt Hand, O2 Device: None (Room air), Heart Rate: 62, (07/11/19 1024) Heart Rate (via Pulse Ox): 62, (07/11/19 1024).  First Pain Reported  0-10 Scale: 10, Pain Location/Orientation: Neck, (07/11/19 1024)       Physical Exam  Vitals and nursing note reviewed.   Constitutional:       Appearance: Normal appearance. She is well-developed.   HENT:      Head: Atraumatic.      Nose: Nose normal.      Mouth/Throat:      Mouth: Mucous membranes are moist.      Pharynx: Oropharynx is clear. Posterior oropharyngeal erythema present. No oropharyngeal exudate.   Cardiovascular:      Rate and Rhythm: Normal rate and regular rhythm.      Heart sounds: Normal heart sounds.   Pulmonary:      Effort: Pulmonary effort is normal. No respiratory distress.      Breath sounds: Normal breath sounds. No stridor.   Musculoskeletal:      Cervical back: Rigidity (pain with lateral rotation) and tenderness (right posterior cervical) present.   Lymphadenopathy:      Cervical: Cervical adenopathy (anterior) present.   Skin:     General:  Skin is warm and dry.   Neurological:      Mental Status: She is alert and oriented to person, place, and time.   Psychiatric:         Behavior: Behavior normal.          Medical Decision Making      Labs Reviewed   STREP A CULTURE, THROAT   COVID-19 PCR       Initial Evaluation:  ED First Provider  Contact     Date/Time Event User Comments    07/11/19 1014 ED First Provider Contact Celine Dishman, [facility] A Initial Face to Face Provider Contact          Patient was seen on: 07/11/2019        Assessment:  44 y.o.female comes to the Urgent [place] with right lateral neck pain and stiffness x 4-days. No injuries. Patient also reports having a sore throat. She has no dysphagia, headaches, dizziness, fevers or chills.     Differential Diagnosis includes:    Cervical Sprain  C-Spine Fracture  Contusion  Trapezius Strain  Torticollis  Whiplash   Viral Pharyngitis  Strep Pharyngitis  Post Nasal Drip  Acute URI  Nasopharyngitis   Tonsillitis  Thrush  Covid-19      Plan:     Sore throat    We will send a throat swab to the lab for a culture. Patient will be notified if the result is positive.    Testing for COVID-19 will also be completed.  Patient to quarantine until results come back.     We will call if it is positive.     If test is positive, the health department will contact with her. She will continue symptomatic care otherwise including:     -Rest and drink plenty of fluids  -Warm Salt water gargles can help alleviate throat discomfort  -Cold drinks and food such as popsicles, ice chips, ice cream, and milkshakes can help soothe throat  -Throat lozenges or sprays for comfort such as Cepacol or Chloraspetic spray.      Neck Pain    Flexeril for muscle spasms. Use as prescribed. Do not drink alcohol, drive, or operate machinery while on this medication as it can be sedating.    Patient may purchase over-the-counter Voltaren and apply to the affected area    Alternate warm and cold compresses as needed    Seek prompt medical attention if symptoms worsen    Follow up with PCP if symptoms persist       Final Diagnosis  Final diagnoses:   [J02.9] Sore throat (Primary)   [M43.6] Torticollis   [Z71.89] Advice given about COVID-19 virus infection         Seward Speck, NP              Seward Speck, NP  07/11/19 1252

## 2019-07-11 NOTE — Discharge Instructions (Addendum)
Sore throat    We will send your throat swab to the lab for a culture. You will only be notified if the result is positive.    Testing for COVID-19 will be completed.  You need to quarantine until results come back.     We will call you if it is positive. RESULTS CAN BE FOUND ON MY CHART    If you test positive, the health department will contact you. You should remain in isolation until your symptoms are improving and you have not had a fever for at least 72 hours (without the use of fever reducing medicines such as ibuprofen and Tylenol).       You should remain isolated from others in your home, cover any coughs/sneezes, do not share common household items (such as cups and silverware) and wash hands frequently.    You need to Continue symptomatic care otherwise including     Rest and drink plenty of fluids.  If your appetite is decreased, focus on hydration.     Warm Salt water gargles can help alleviate throat discomfort    Cold drinks and food such as popsicles, ice chips, ice cream, and milkshakes can help soothe throat    You may try throat lozenges or sprays for comfort such as Cepacol or Chloraspetic spray.    Ibuprofen can be taken every 6 hours for the next 3 days. Take with food to avoid stomach upset      Neck Pain    Please use flexeril for muscle spasms. Use as prescribed. Do not drink alcohol, drive, or operate machinery while on this medication as it can be sedating.    You may purchase over-the-counter Voltaren and apply to the affected area    Alternate warm and cold compresses as needed    Seek prompt medical attention if symptoms worsen    Follow up with your PCP if symptoms persist

## 2019-07-13 LAB — STREP A CULTURE, THROAT: Group A Strep Throat Culture: 0

## 2019-09-06 ENCOUNTER — Ambulatory Visit
Admit: 2019-09-06 | Discharge: 2019-09-06 | Disposition: A | Discharge status: Home / Self Care | Payer: No Typology Code available for payment source

## 2019-09-06 ENCOUNTER — Ambulatory Visit
Admission: AD | Admit: 2019-09-06 | Discharge: 2019-09-06 | Disposition: A | Discharge status: Home / Self Care | Payer: No Typology Code available for payment source | Source: Ambulatory Visit | Attending: Family Medicine | Admitting: Family Medicine

## 2019-09-06 DIAGNOSIS — Y998 Other external cause status: Secondary | ICD-10-CM | POA: Insufficient documentation

## 2019-09-06 DIAGNOSIS — S7002XA Contusion of left hip, initial encounter: Secondary | ICD-10-CM | POA: Insufficient documentation

## 2019-09-06 DIAGNOSIS — Y9289 Other specified places as the place of occurrence of the external cause: Secondary | ICD-10-CM | POA: Insufficient documentation

## 2019-09-06 DIAGNOSIS — S8002XA Contusion of left knee, initial encounter: Secondary | ICD-10-CM

## 2019-09-06 DIAGNOSIS — Y9389 Activity, other specified: Secondary | ICD-10-CM | POA: Insufficient documentation

## 2019-09-06 DIAGNOSIS — M25562 Pain in left knee: Secondary | ICD-10-CM

## 2019-09-06 DIAGNOSIS — S46812A Strain of other muscles, fascia and tendons at shoulder and upper arm level, left arm, initial encounter: Secondary | ICD-10-CM | POA: Insufficient documentation

## 2019-09-06 DIAGNOSIS — W07XXXA Fall from chair, initial encounter: Secondary | ICD-10-CM | POA: Insufficient documentation

## 2019-09-06 NOTE — ED Triage Notes (Signed)
patient  Here after falling from a chair at work  Injuring left knee and shoulder and hip area      Triage Note   Rick Duff, LPN

## 2019-09-06 NOTE — Discharge Instructions (Signed)
Tylenol 1000mg  every 8hrs for pain  Ice 20 minutes on several times a day x 3 days to areas of pain  Elevate the knee with support from a pillow.  Rest the knee; limit the walking for a few days.  May wear ace wrap to knee for comfort and support    Highly recommend massage therapy for the upper back.    X-Ray  You had an x-ray done today that was read by the provider who cared for you. It was interpreted as negative. The radiologist will review the films. If he/she interprets them differently, you will be contacted.     Call for appt if not improving:  [facility] orthopedic [phone] at [address] in [place]

## 2019-09-06 NOTE — UC Provider Note (Signed)
History     Chief Complaint   Patient presents with    Fall     Pt is a 44yo female who was at work today, standing on a chair with wheels to reach for something and fell onto the left side; having pain and swelling of the left knee, left hip area and back of the left shoulder area. Denies hitting head; no LOC, no neck pain.          Medical/Surgical/Family History     Past Medical History:   Diagnosis Date    Anxiety     Complication of anesthesia     nausea and emesis    Depression     Fatigue     GERD (gastroesophageal reflux disease)     Headache     Morbid obesity     Peripheral edema     during pregnancy    PONV (postoperative nausea and vomiting)     Positive H. pylori titer     treated - 2015    Prediabetes     no meds    Shortness of breath     Sleep apnea     Snoring     Stress incontinence     Supraventricular tachycardia 2011    Hasn't required atenolol in 2 yrs for "racing HR"    Vision abnormalities         Patient Active Problem List   Diagnosis Code    Morbid obesity, OSA, pre-DM2, SVT s/p lap RYGB 8/1. 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    Allergic rhinitis J30.9    Excessive daytime sleepiness G47.19    Depression F32.9    BMI 27.0-27.9,adult Z68.27            Past Surgical History:   Procedure Laterality Date    BREAST REDUCTION SURGERY Bilateral 07/2001    BREAST SURGERY  2003    reduction    COLONOSCOPY      EYE SURGERY Bilateral 2014    tear duct     PR LAP GASTRIC BYPASS/ROUX-EN-Y N/A 10/08/2016    Procedure: LAPAROSCOPIC GASTRIC BYPASS;  Surgeon: Darrell Jewel, MD;  Location: [facility] MAIN OR;  Service: General     Family History   Problem Relation Age of Onset    High Blood Pressure Mother     Anesthesia problems Mother     Arthritis Mother     Obesity Mother     High cholesterol Mother     Aneurysm Paternal Grandfather         heart    Heart Disease Paternal Grandfather      Morbid Obesity Paternal Grandfather     Arthritis Paternal Grandfather     Early death Paternal Grandfather     High Blood Pressure Paternal Grandfather     Arthritis Maternal Grandfather     COPD Maternal Grandfather     Depression Maternal Grandfather     Stroke Maternal Grandfather     Asthma Maternal Grandfather     High Blood Pressure Maternal Grandmother     Arthritis Maternal Grandmother     Cancer Paternal Grandmother 71        breast    Morbid Obesity Paternal Grandmother     Arthritis Paternal Grandmother     Breast cancer Paternal Grandmother 23    Mental illness Father     Ovarian  cancer Neg Hx           Social History     Tobacco Use    Smoking status: Former Smoker     Quit date: 02/07/1997     Years since quitting: 22.5    Smokeless tobacco: Never Used   Substance Use Topics    Alcohol use: No     Comment: rarely once every other month    Drug use: Yes     Types: Marijuana     Living Situation     Questions Responses    Patient lives with Child    Homeless     Caregiver for other family member     External Services     Employment Employed    Domestic Violence Risk No                Review of Systems   Review of Systems   Constitutional: Negative for fever.   Musculoskeletal: Negative for neck pain and neck stiffness.        Left knee, left hip and back of left shoulder pain.   Neurological: Negative for dizziness, weakness, light-headedness, numbness and headaches.   All other systems reviewed and are negative.      Physical Exam   Triage Vitals  Triage Start: Start, (09/06/19 1807)   First Recorded BP: 117/75, Resp: 14, Temp: 37.5 C (99.5 F), Temp src: TEMPORAL Oxygen Therapy SpO2: 95 %, Oximetry Source: Rt Hand, O2 Device: None (Room air), Heart Rate: 82, (09/06/19 1811) Heart Rate (via Pulse Ox): 82, (09/06/19 1811).  First Pain Reported  0-10 Scale: 5, Pain Location/Orientation: Knee Left, (09/06/19 1811)       Physical Exam  Vitals and nursing note reviewed.   Constitutional:        General: She is not in acute distress.     Appearance: Normal appearance. She is well-developed.   Musculoskeletal:      Left shoulder: Tenderness present. No swelling or deformity. Normal range of motion. Normal strength. Normal pulse.        Arms:       Cervical back: Normal range of motion and neck supple. No tenderness.      Left hip: Tenderness present. No deformity or bony tenderness. Normal range of motion. Normal strength.      Left knee: Swelling and ecchymosis present. No deformity, erythema or bony tenderness. Normal range of motion. Tenderness present over the LCL. Normal pulse.        Legs:    Skin:     General: Skin is warm and dry.   Neurological:      Mental Status: She is alert and oriented to person, place, and time.   Psychiatric:         Mood and Affect: Mood normal.         Behavior: Behavior normal.         Thought Content: Thought content normal.         Judgment: Judgment normal.          Medical Decision Making     ED Course as of Sep 06 1830   Tue Sep 06, 2019   1823 No knee fracture        Patient seen by me: 09/06/2019   ED First Provider Contact     Date/Time Event User Comments    09/06/19 1803 ED First Provider Contact Romana Juniper, Thressa Shiffer Initial Face to Face Provider Contact  Assessment:  44 y.o., female comes to the [facility] who was at work today, standing on a chair with wheels to reach for something and fell onto the left side; having pain and swelling of the left knee, left hip area and back of the left shoulder area. Denies hitting head; no LOC, no neck pain.    Labs / Imaging:     * Knee LEFT standard AP, Oblique, and Lateral views    (Results Pending)         Differential diagnosis:   Contusion  Sprain  Fracture  Muscle spasm    Plan:   Xrays.  Ace wrap applied to the left knee.    Pt instructions:  Tylenol 1000mg  every 8hrs for pain  Ice 20 minutes on several times a day x 3 days to areas of pain  Elevate the knee with support from a pillow.  Rest the knee; limit the  walking for a few days.  May wear ace wrap to knee for comfort and support    Highly recommend massage therapy for the upper back.    X-Ray  You had an x-ray done today that was read by the provider who cared for you. It was interpreted as negative. The radiologist will review the films. If he/she interprets them differently, you will be contacted.     Call for appt if not improving:  [facility] orthopedic [phone] at [address] in [place] [phone]    Orders Placed This Encounter    * Knee LEFT standard AP, Oblique, and Lateral views          Course & disposition: discharge to home  <UCMDM>    Final Diagnosis  Final diagnoses:   [S80.02XA] Contusion of left knee, initial encounter (Primary)   [S70.02XA] Contusion of left hip, initial encounter   [place] Trapezius muscle strain, left, initial encounter         [P54.656C], NP              Treasa School, NP  09/06/19 1832

## 2019-09-19 ENCOUNTER — Ambulatory Visit
Admission: RE | Admit: 2019-09-19 | Discharge: 2019-09-19 | Disposition: A | Discharge status: Home / Self Care | Payer: No Typology Code available for payment source | Source: Ambulatory Visit | Attending: Obstetrics and Gynecology | Admitting: Obstetrics and Gynecology

## 2019-09-19 DIAGNOSIS — N6320 Unspecified lump in the left breast, unspecified quadrant: Secondary | ICD-10-CM | POA: Insufficient documentation

## 2019-09-19 DIAGNOSIS — N6489 Other specified disorders of breast: Secondary | ICD-10-CM | POA: Insufficient documentation

## 2019-09-22 ENCOUNTER — Other Ambulatory Visit: Payer: Self-pay | Admitting: Obstetrics and Gynecology

## 2019-09-22 DIAGNOSIS — R928 Other abnormal and inconclusive findings on diagnostic imaging of breast: Secondary | ICD-10-CM

## 2019-09-22 NOTE — Progress Notes (Signed)
Please schedule this for January 2022 6 months from last study

## 2019-09-26 NOTE — Progress Notes (Signed)
Scheduled for 03/27/20 at 8:30 am

## 2019-10-18 ENCOUNTER — Encounter: Payer: Self-pay | Admitting: Bariatrics

## 2019-10-18 ENCOUNTER — Ambulatory Visit: Payer: No Typology Code available for payment source | Attending: Bariatrics | Admitting: Bariatrics

## 2019-10-18 VITALS — BP 109/74 | HR 70 | Temp 97.2°F | Resp 18 | Ht 60.98 in | Wt 152.0 lb

## 2019-10-18 DIAGNOSIS — R635 Abnormal weight gain: Secondary | ICD-10-CM | POA: Insufficient documentation

## 2019-10-18 DIAGNOSIS — Z9884 Bariatric surgery status: Secondary | ICD-10-CM

## 2019-10-18 DIAGNOSIS — K909 Intestinal malabsorption, unspecified: Secondary | ICD-10-CM | POA: Insufficient documentation

## 2019-10-18 DIAGNOSIS — Z6828 Body mass index (BMI) 28.0-28.9, adult: Secondary | ICD-10-CM | POA: Insufficient documentation

## 2019-10-18 LAB — CBC
Hematocrit: 40 % (ref 34–45)
Hemoglobin: 13.2 g/dL (ref 11.2–15.7)
MCH: 32 pg (ref 26–32)
MCHC: 33 g/dL (ref 32–36)
MCV: 97 fL — ABNORMAL HIGH (ref 79–95)
Platelets: 294 10*3/uL (ref 160–370)
RBC: 4.1 MIL/uL (ref 3.9–5.2)
RDW: 11.9 % (ref 11.7–14.4)
WBC: 8.8 10*3/uL (ref 4.0–10.0)

## 2019-10-18 LAB — FERRITIN: Ferritin: 431 ng/mL — ABNORMAL HIGH (ref 10–120)

## 2019-10-18 LAB — COMPREHENSIVE METABOLIC PANEL
ALT: 32 U/L (ref 0–35)
AST: 21 U/L (ref 0–35)
Albumin: 4 g/dL (ref 3.5–5.2)
Alk Phos: 87 U/L (ref 35–105)
Anion Gap: 11 (ref 7–16)
Bilirubin,Total: 0.4 mg/dL (ref 0.0–1.2)
CO2: 26 mmol/L (ref 20–28)
Calcium: 9.6 mg/dL (ref 8.8–10.2)
Chloride: 102 mmol/L (ref 96–108)
Creatinine: 0.88 mg/dL (ref 0.51–0.95)
GFR,Black: 94 *
GFR,Caucasian: 81 *
Glucose: 88 mg/dL (ref 60–99)
Lab: 8 mg/dL (ref 6–20)
Potassium: 4.4 mmol/L (ref 3.3–5.1)
Sodium: 139 mmol/L (ref 133–145)
Total Protein: 6.9 g/dL (ref 6.3–7.7)

## 2019-10-18 LAB — IRON: Iron: 128 ug/dL (ref 34–165)

## 2019-10-18 LAB — VITAMIN B12: Vitamin B12: 1347 pg/mL — ABNORMAL HIGH (ref 232–1245)

## 2019-10-18 LAB — FOLATE: Folate: >14 ng/mL (ref >=4.6)

## 2019-10-18 LAB — VITAMIN D: 25-OH Vit Total: >100 ng/mL — ABNORMAL HIGH (ref 30–60)

## 2019-10-18 LAB — PTH, INTACT: Intact PTH: 20.6 pg/mL (ref 15.0–65.0)

## 2019-10-18 NOTE — Patient Instructions (Addendum)
Continue supplements     bariatric information handout given      Look at bovine colostrum by [facility]      Look at the brainery site for the sugar detox class

## 2019-10-18 NOTE — Progress Notes (Addendum)
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 Gershon Mussel, M.D., F.A.C.S.       Bariatric Vitals:  Vitals 10/18/2019 07/11/2019 10/18/2018 03/16/2018 10/20/2017   Preop Weight 236 lb - 236 lb - -   Weight 152 lb 148 lb 143 lb 9.6 oz 129 lb 3.2 oz 125 lb 3.2 oz   BAR Weight (lbs) 152 - 143.6 - 125.2   Height 5' 0.984" 5' 0.984" 5' 0.984" 5\' 1"  5' 0.984"   BAR Height (in) 60.98 - 60.98 - 60.98   BMI (Calculated) 28.8 kg/m2 28 kg/m2 27.2 kg/m2 24.5 kg/m2 23.7 kg/m2   BAR BMI (Calculated) 28.8 kg/m2 - 27.2 kg/m2 - 23.7 kg/m2   IBW in kg (Bariatric) 47.59 47.59 47.59 47.63 47.59   IBW in lbs (Bariatric) 104.92 104.92 104.92 105 104.92   Percent Excess Weight Loss -64.08 % -67.14 % -70.49 % -123.01 % -84.54 %   Weight Change for Visit (kg) 1.81 2 6.53 58.59 0   Weight Change Total (kg) -38.09 -39.91 -41.9 58.59 -50.25   Initial Excess Weight 59.44 59.44 59.44 -47.63 59.44   Total Weight Loss Percent -35.59 % -37.29 % -39.15 % 2222 % -46.95 %     Vitals 10/18/2019 09/06/2019 07/11/2019 01/04/2019 10/18/2018   BP 109/74 117/75 128/69 118/80 126/64   Pulse 70 82 62 78 81   Resp 18 14 14 15 18    Temp 97.2 99.5 98.2 98.6 99       ACS Information:  ACS 10/18/2019 10/18/2019 09/22/2019 09/19/2019 09/06/2019   Weight Change Since Preop -38.09 - - - -   DIAGNOSES THIS VISIT - BMI 28.0-28.9,adult  Bariatric surgery status  Intestinal malabsorption, unspecified type Abnormal mammogram of left breast Left breast mass -   TREATMENTS - - - - -       Subjective:  Patient returns for a standard post operative evaluation. A review of pertinent symptoms reveals:  [name] complains of diarrhea  Pertinent  negatives include abdominal pain, nausea and heartburn or reflux      Co-Morbid Conditions:  These comorbid conditions existed prior to surgery: obstructive sleep apnea, depression, anxiety, GERD  .    Patient does have sleep apnea and is compliant with using CPAP  Patient does not have diabetes.   Patient does not have hypertension treated with medication.  Patient does not have GERD treated with PPI or H2  Patient does not have hyperlipidemia treated with medication    Complications:  Patient was not diagnosed with venous thrombosis postoperatively.  Patient was not diagnosed with a pulmonary emobolism postoperatively.  Patient does not have a new incisional hernia    Current Medications, Vitamins, and Supplements list:  Medications/Vitamins/Supplements         Last Dose Start Date End Date Provider     auto-titrating CPAP (AUTOSET) machine Taking  --  --  [provider]     By no specified route     biotin 5 MG tablet Taking  --  --  [provider]     Take 5 mg by mouth  daily     buPROPion (WELLBUTRIN XL) 150 MG 24 hr tablet Taking  06/28/19  --  [provider]     Take 150 mg by mouth daily     calcium citrate-vitamin D (CALCIUM CITRATE + D) 315-250 MG-UNIT per tablet Taking  --  --  [provider]     Take 2 tablets by mouth 2 times daily Takes one calcium with d and one plain calcium     cetirizine (ZYRTEC) 10 MG tablet Taking  --  --  [provider]     Take 10 mg by mouth as needed for Allergies     cyanocobalamin (VITAMIN B-12) 1000 MCG tablet Taking  --  --  [provider]     Take 1,000 mcg by mouth daily     cyclobenzaprine (FLEXERIL) 5 MG tablet Not Taking  07/11/19  --  Tanda Rockers, NP     Take 2 tablets (10 mg total) by mouth 3 times daily as needed for Muscle spasms     Patient not taking: Reported on 10/18/2019     escitalopram (LEXAPRO) 10 MG tablet Taking  --  --  [provider]     Take 10 mg by mouth daily     ferrous  sulfate 325 (65 FE) MG tablet Taking  --  --  [provider]     Take 325 mg by mouth daily (with breakfast)     generic DME Taking  04/13/18  --  Donnamarie Rossetti, PA     Perform mask fitting & provide all needed PAP replacement supplies incl. heated humidifier & climate line tubing. Duration: Lifetime  She is available after 04/20/18 for fitting     Levonorgestrel (MIRENA, 52 MG, IU) Taking  --  --  [provider]     by Intrauterine route     multi-vitamin (MULTIVITAMIN) per tablet Taking  --  --  [provider]     Take 1 tablet by mouth daily     thiamine 50 MG tablet Taking  --  --  [provider]     Take 50 mg by mouth daily          Social History:  [name]  reports that she quit smoking about 22 years ago. She has never used smokeless tobacco.   She  reports no history of alcohol use.  Ritamarie reports she does not use NSAIDS.  She does not attend support meetings.  [name] does not exercise.    Diet History:  STEPHAINE BRESHEARS does not adhere to the current meal plan.sweets  [name] is not using protein supplements.  She does use caffeine.diet coke    Physical Exam:  Physical Exam   Vitals reviewed.  Constitutional: She is oriented to person, place, and time. She appears well-developed.   HENT:   Head: Normocephalic.   Pulmonary/Chest: Effort normal.   Abdominal: Soft. Bowel sounds are normal. There is no abdominal tenderness.   Musculoskeletal:         General: Normal range of motion.      Cervical back: Normal range of motion.   Neurological: She is alert and oriented to person, place, and time.   Skin: Skin is warm and dry.   Psychiatric: She has a normal mood and affect.        Assessment:  Patient complains GG:YIRSWN gain  Gastrointestinal: negative  Post op course has been uneventful.  Weight loss has been  below average.  She is compliant with diet.    US breast limited LEFT    Result Date: 09/19/2019  Probably benign finding.  We recommend targeted  left breast ultrasound at the time of annual screening mammography, which the patient is due for in January 2022. Your patient has been informed of these findings.  We will also mail these results  and a reminder when it is time for her next examination. BI-RADS ASSESSMENT CATEGORY:  3: Probably Benign Finding. [facility] submits this DICOM format image data and final report to the [facility], an independent secure electronic health information exchange, on a reciprocally searchable basis (with patient authorization) for a minimum of 12 months after exam date.         Plan:  LESHIA KOPE was educated on Diet: lifestyle  Exercise: Begin exercise.     Orders Placed This Encounter   Procedures    Folate    Iron    Vitamin B12    Vitamin D    Ferritin    CBC    Comprehensive metabolic panel    PTH, intact       Return in about 1 year (around 10/17/2020).

## 2019-10-18 NOTE — Addendum Note (Signed)
Addended by: Sheran Lawless on: 10/18/2019 10:14 AM     Modules accepted: Orders

## 2019-10-18 NOTE — Addendum Note (Signed)
Addended by: Shara Blazing on: 10/18/2019 10:19 AM     Modules accepted: Orders

## 2019-10-23 ENCOUNTER — Encounter: Payer: Self-pay | Admitting: Bariatrics

## 2019-10-24 ENCOUNTER — Other Ambulatory Visit: Payer: Self-pay | Admitting: Bariatrics

## 2019-10-24 DIAGNOSIS — K909 Intestinal malabsorption, unspecified: Secondary | ICD-10-CM

## 2019-11-28 ENCOUNTER — Encounter: Payer: Self-pay | Admitting: Bariatrics

## 2020-01-18 ENCOUNTER — Encounter: Payer: Self-pay | Admitting: Obstetrics and Gynecology

## 2020-01-20 ENCOUNTER — Encounter: Payer: Self-pay | Admitting: Obstetrics and Gynecology

## 2020-01-20 ENCOUNTER — Telehealth: Payer: Self-pay | Admitting: Obstetrics and Gynecology

## 2020-01-20 NOTE — Telephone Encounter (Signed)
ERROR

## 2020-01-20 NOTE — Telephone Encounter (Signed)
Called pt, booked w/ [facility] on 11/15.

## 2020-01-20 NOTE — Telephone Encounter (Signed)
Patient is calling. She has an infected bump on her "butt cheek" where it connects to her vagina. Is hard and very sore.  Asking for appt but I thought she needed to speak to a nurse first

## 2020-01-20 NOTE — Telephone Encounter (Signed)
Pt states 4 weeks ago she had a wax appointment and a week later she noticed a boil/cyst between her butt cheek and vagina. Pt states its the size of a quarter. States 1st day she noticed it, she tried popping it and she states "it popped like a blood blister". Did not change size or discomfort by popping, still very large and hasn't been able to pop since. Does not cause her any pain, just a lot of discomfort. Pt would like to be seen.    Do we have any available appointments next week? She is okay with seeing someone else if Dr. Carlynn Purl is unavailable (which I believe she is). Maybe Lequita Halt or Darel Hong? Thank you!

## 2020-01-23 ENCOUNTER — Ambulatory Visit: Payer: No Typology Code available for payment source | Attending: Physician Assistant | Admitting: Physician Assistant

## 2020-01-23 ENCOUNTER — Encounter: Payer: Self-pay | Admitting: Physician Assistant

## 2020-01-23 VITALS — Ht 61.0 in | Wt 160.0 lb

## 2020-01-23 DIAGNOSIS — L731 Pseudofolliculitis barbae: Secondary | ICD-10-CM | POA: Insufficient documentation

## 2020-01-23 NOTE — Progress Notes (Signed)
[facility]   Acute GYN Visit    CHIEF COMPLAINT:   Chief Complaint   Patient presents with    Follow-up     Here alone for c/o "bump" in her peri-area after a wax.          HISTORY OF PRESENT ILLNESS  44 y.o.  No obstetric history on file.  female who presents to clinic today complaining of a bump in her perineum. She had a wax about 3 weeks ago and shortly after developed this bump, she reports it filled with blood and then burst. Since then has significantly decreased in size but is still slightly sore. She has not had any purulent drainage.     PMH, PSH, SH, FH, Meds & Allergies reviewed and updated as appropriate.    REVIEW OF SYSTEMS   Negative unless stated in HPI      PHYSICAL EXAM  Vitals:    01/23/20 1654   Weight: 72.6 kg (160 lb)   Height: 1.549 m (5\' 1" )         General - Pleasant, well-appearing female, in NAD  Resp - Normal respiratory effort  CV - not examined, Normal rate, normal rhythm  Pelvic - There is a pea sized ingrown hair to the left side of her perineum, no surrounding redness, no induration or fluctuance. No drainage when attempt to express       ASSESSMENT/PLAN  44 y.o.   woman with ingrown hair to perineum    It is possible she had a hemorrhagic blister after the wax as well but now it seems she has an ingrown hair-- however there is no indication for incision today, there is no infection or purulence. Advised warm compresses, refrain from waxing and topical antibiotic ointment    Return to clinic as needed

## 2020-03-05 ENCOUNTER — Ambulatory Visit: Payer: No Typology Code available for payment source | Admitting: Physician Assistant

## 2020-03-05 ENCOUNTER — Telehealth: Payer: Self-pay

## 2020-03-05 NOTE — Telephone Encounter (Signed)
Called pt who has questions about her Annual appt today. Pt is not quite at a year and would be charged for her annual appt. Pt states she always has her annual at this time of year but last year it got rescheduled to 03/17/2019. 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 Made pt aware of this and that [facility] wanted to see her for a f/u pap. 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 Ty

## 2020-03-07 ENCOUNTER — Ambulatory Visit
Admission: AD | Admit: 2020-03-07 | Discharge: 2020-03-07 | Disposition: A | Discharge status: Home / Self Care | Payer: No Typology Code available for payment source | Source: Ambulatory Visit

## 2020-03-07 DIAGNOSIS — Z20822 Contact with and (suspected) exposure to covid-19: Secondary | ICD-10-CM | POA: Insufficient documentation

## 2020-03-07 LAB — COVID-19 PCR

## 2020-03-07 LAB — COVID-19 NAAT (PCR): COVID-19 NAAT (PCR): NEGATIVE

## 2020-03-07 NOTE — UC Provider Note (Signed)
Brooke Hanson is a 44 y.o. female presents to Urgent Care requesting COVID-19 testing. [name] has no symptoms concerning for COVID-19. Patient was exposed. The patient has been informed that COVID-19 PCR result will not be available immediately. The patient has no other complaints that they would like to be evaluated for today.              [name], [place], NP  03/07/20 1256

## 2020-03-07 NOTE — Discharge Instructions (Signed)
Testing for COVID-19 was completed.  You need to quarantine until results come back // regardless of vaccination status if you were exposed.     We will call you if it is positive. RESULTS CAN BE FOUND ON MY CHART.    If you test positive, the health department will contact you. You should remain in isolation until your symptoms are improving and you have not had a fever for at least 72 hours (without the use of fever reducing medicines such as ibuprofen and Tylenol).       You should remain isolated from others in your home, cover any coughs/sneezes, do not share common household items (such as cups and silverware) and wash hands frequently.    You need to Continue symptomatic care otherwise including:     -You may take vitamin D and zinc daily as directed   -Need to rest, your body needs extra sleep,   -Drink lots of fluids especially water, when you’re sick.  -Gargle with warm salt water especially in the morning when you first wake up, this will help your throat.  -Take Tylenol/motrin for aches and fever.  -Take an allergy medication: Claritin, or Zyrtec or Allegra. This will help with nasal symptoms.  -For nasal congestion also okay to take a nasal decongestant such as Sudafed x 3 days.  -Use saline nasal spray to help loosen nasal secretions.  -Mucinex dm for cough and congestion.  -Cool mist vaporizer/ humidifier  -Warm tea with honey (both honey and elderberry syrup have natural anti-viral properties).  - Bland diet, smaller meals    If your symptoms are worsening such as shortness of breath, chest pain, fevers that are not reducing with tylenol / motrin, seek further immediate care in the ER.

## 2020-03-07 NOTE — ED Triage Notes (Signed)
Exposure on Saturday to a positive friend    Covid vaccinated  Declines the option to see a provider today

## 2020-03-19 ENCOUNTER — Ambulatory Visit: Payer: No Typology Code available for payment source | Admitting: Obstetrics and Gynecology

## 2020-03-19 ENCOUNTER — Ambulatory Visit: Payer: No Typology Code available for payment source

## 2020-03-22 NOTE — Telephone Encounter (Signed)
Patient is a ALP patient, request that week between christmas and new years.

## 2020-03-27 ENCOUNTER — Ambulatory Visit: Payer: No Typology Code available for payment source

## 2020-03-27 ENCOUNTER — Ambulatory Visit: Payer: No Typology Code available for payment source | Admitting: Radiology

## 2020-03-28 NOTE — Telephone Encounter (Signed)
Reminder placed in pt chart

## 2020-04-16 ENCOUNTER — Ambulatory Visit
Admission: RE | Admit: 2020-04-16 | Discharge: 2020-04-16 | Disposition: A | Discharge status: Home / Self Care | Payer: No Typology Code available for payment source | Source: Ambulatory Visit

## 2020-04-16 ENCOUNTER — Ambulatory Visit
Admission: RE | Admit: 2020-04-16 | Discharge: 2020-04-16 | Disposition: A | Discharge status: Home / Self Care | Payer: No Typology Code available for payment source | Source: Ambulatory Visit | Attending: Obstetrics and Gynecology | Admitting: Obstetrics and Gynecology

## 2020-04-16 ENCOUNTER — Other Ambulatory Visit: Payer: Self-pay | Admitting: Obstetrics and Gynecology

## 2020-04-16 DIAGNOSIS — R928 Other abnormal and inconclusive findings on diagnostic imaging of breast: Secondary | ICD-10-CM

## 2020-04-16 DIAGNOSIS — N6489 Other specified disorders of breast: Secondary | ICD-10-CM

## 2020-04-19 ENCOUNTER — Other Ambulatory Visit: Payer: Self-pay | Admitting: Obstetrics and Gynecology

## 2020-04-19 DIAGNOSIS — R928 Other abnormal and inconclusive findings on diagnostic imaging of breast: Secondary | ICD-10-CM

## 2020-10-01 ENCOUNTER — Ambulatory Visit: Payer: Self-pay | Admitting: Radiology

## 2020-10-01 ENCOUNTER — Ambulatory Visit: Payer: Self-pay

## 2021-04-23 ENCOUNTER — Telehealth: Payer: Self-pay | Admitting: Surgery

## 2021-04-23 NOTE — Telephone Encounter (Signed)
MBSCR left message with clinic phone number encouraging patient to make follow-up appt.  Letter sent.

## 2021-05-23 IMAGING — MG MAMMO BREAST SCREENING BILATERAL
8 of 15 series · 8 of 35 positions shown · non-contrast
Comparison: None.
Breast density: Scattered fibroglandular densities (B)

This is a summary report. The complete report is available in the patient's medical record. If you cannot access the medical record, please contact the sending organization for a detailed fax or copy.
FINAL REPORT:
MAMMO BREAST SCREENING TOMOSYNTHESIS 3D BILATERAL
INDICATION: 45-year-old female whose paternal grandmother developed breast cancer at age 85.
TECHNIQUE: Bilateral digital CC and MLO images of the breasts were obtained with tomosynthesis. CAD was utilized.

[R MLO synth-2D (1 of 2)]
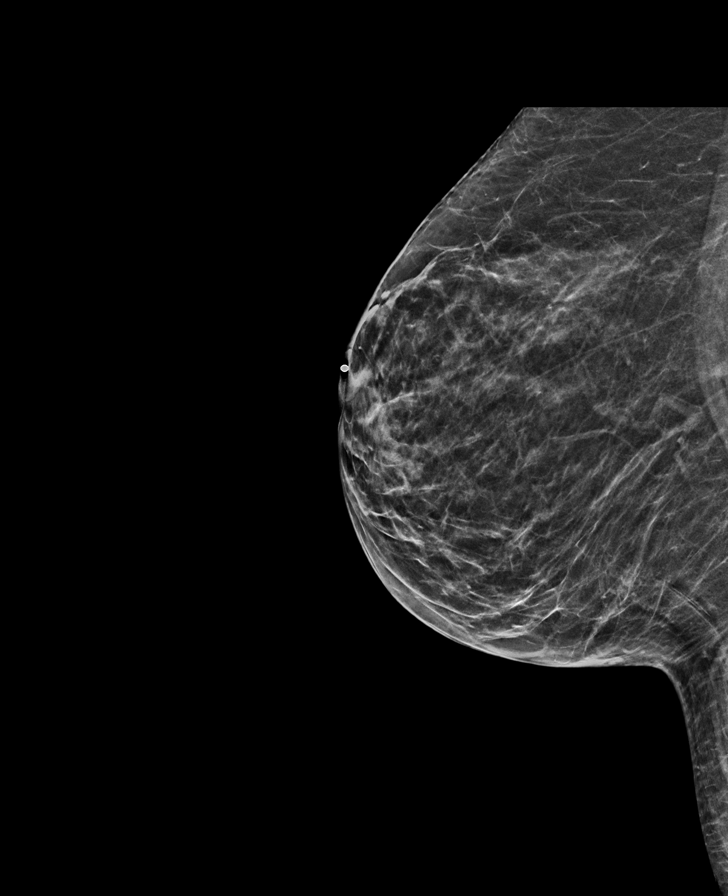

[R CC synth-2D]
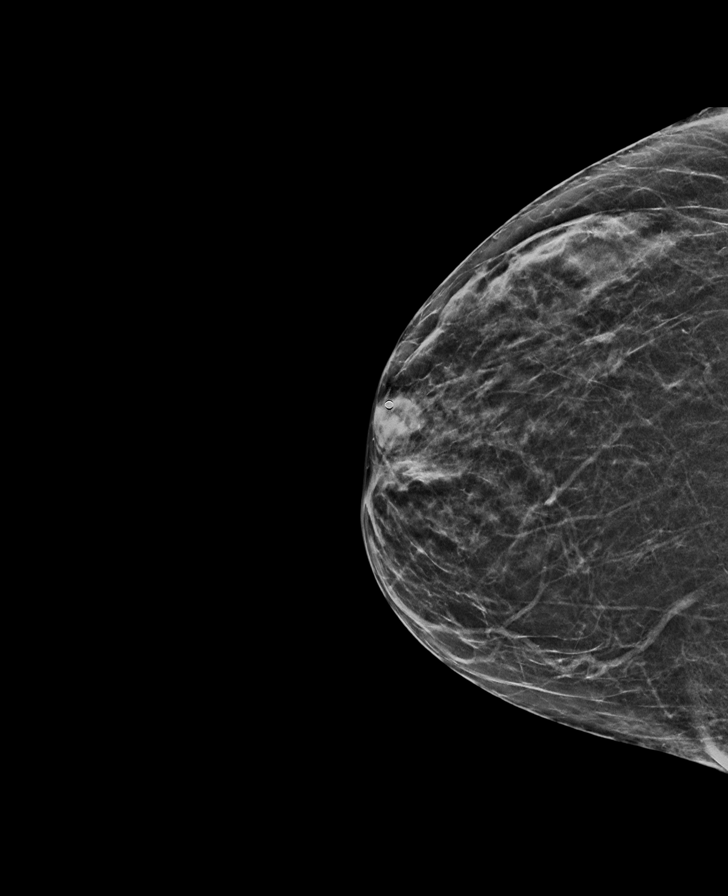

[L MLO]
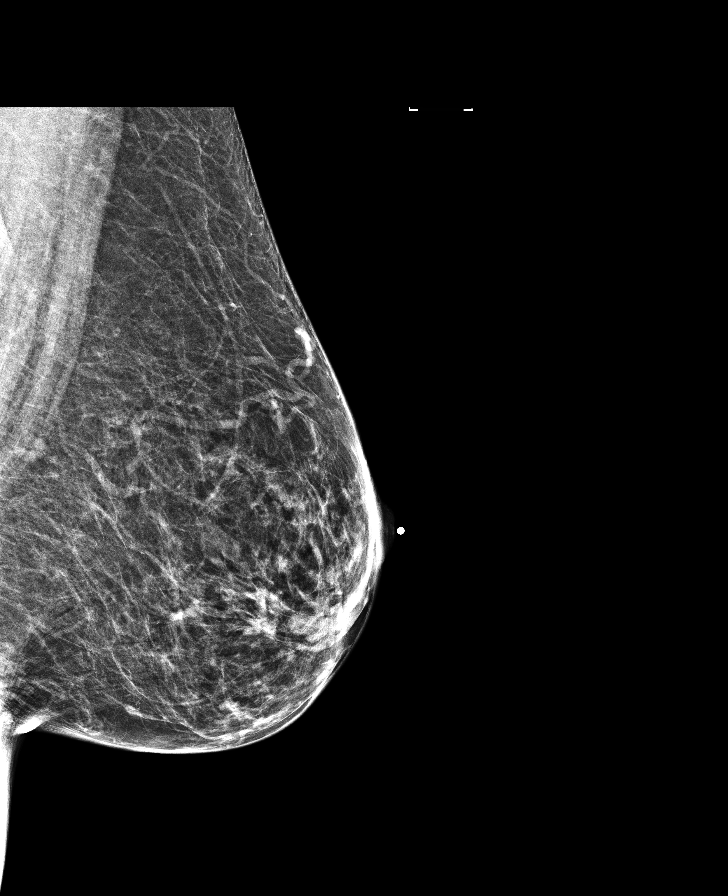

[L CC]
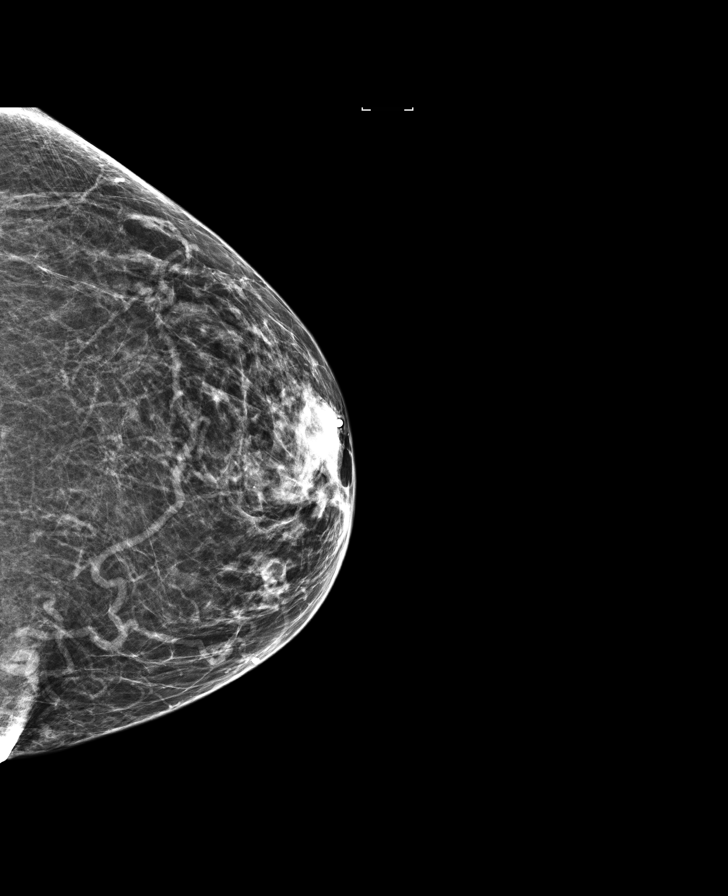

[R MLO]
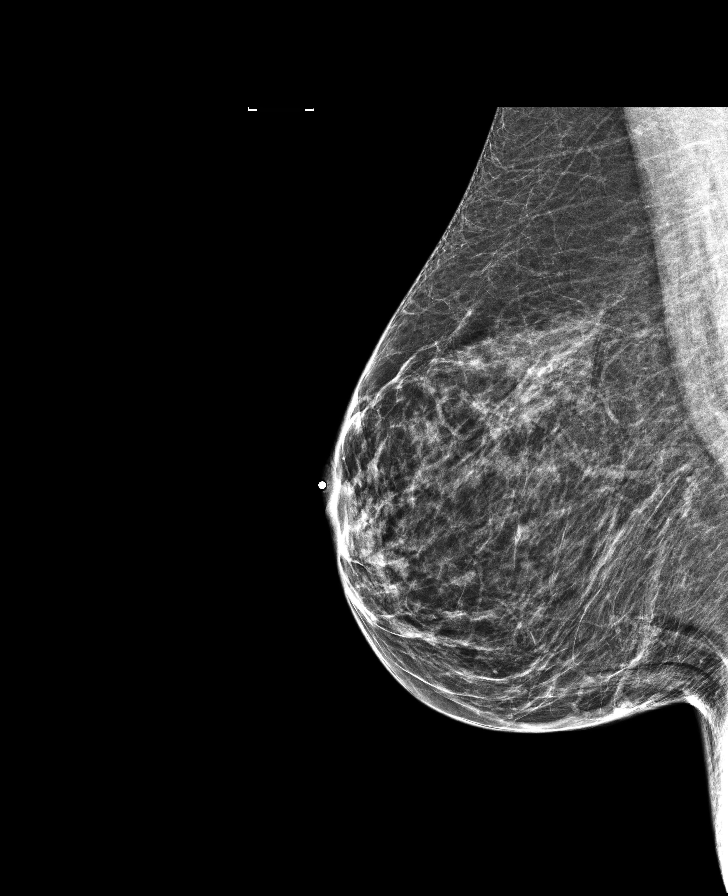

[R MLO synth-2D (2 of 2)]
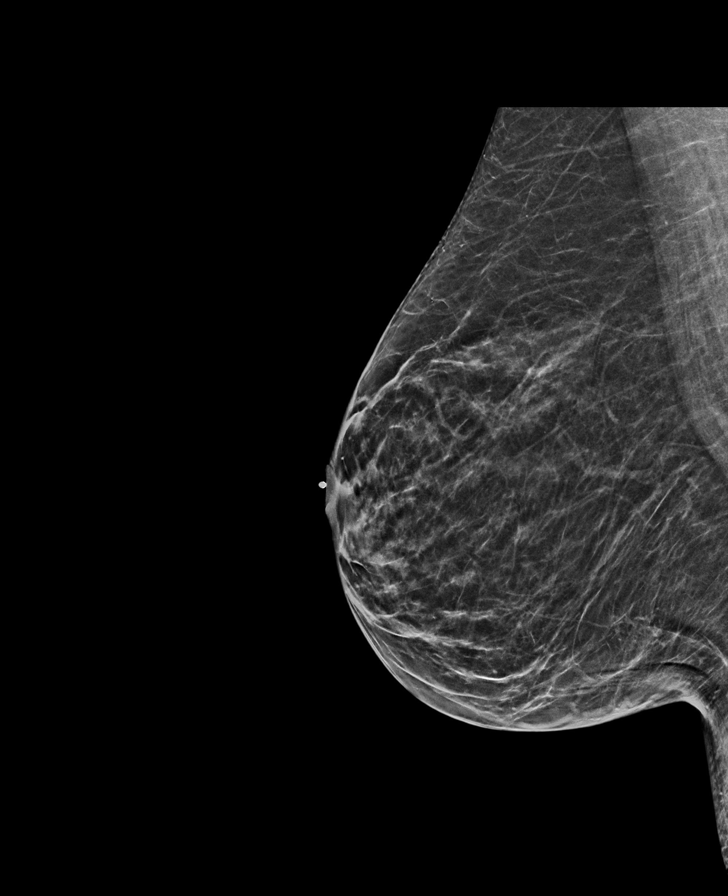

[R CC]
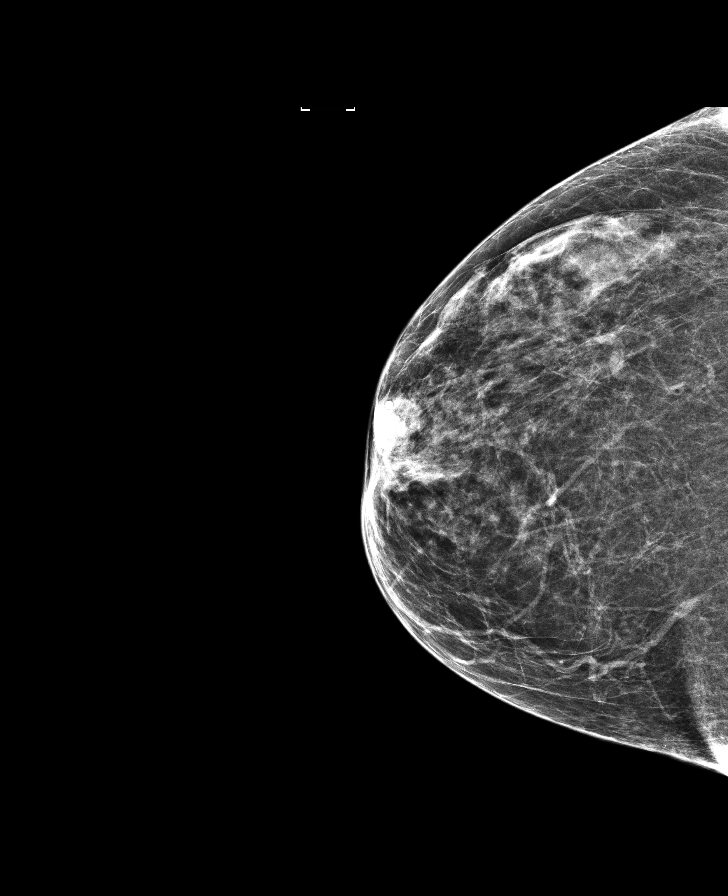

[L CC synth-2D]
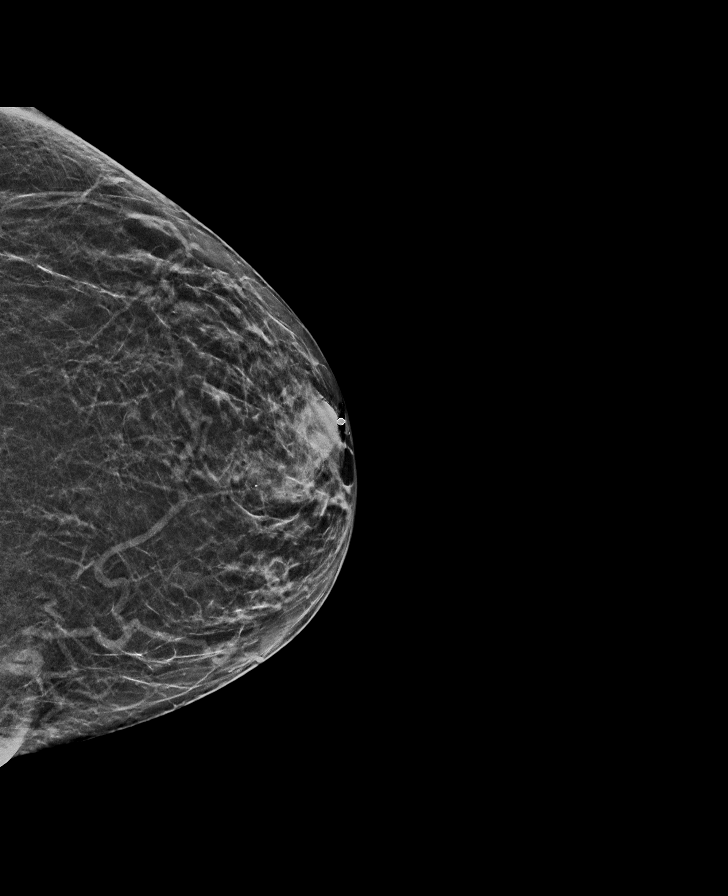

[8 of 35 positions shown; findings below may reference images not displayed]

FINDINGS: No significant abnormalities are noted.
IMPRESSION: 
IMPRESSION: No mammographic evidence of malignancy.
Recommendations: Annual screening.
BI-RADS code: 1; negative
**One must recognize that there is as high as a 10-15% false-negative rate on a single screening mammogram. Current recommendations are for annual screening mammography from age 40 onwards.**
The patient will be entered into a reminder system with a target due date for the next mammogram.
Is the patient pregnant?
No

## 2021-09-29 IMAGING — CR Chest
3 series · 3 of 3 positions shown · non-contrast
Comparison: none

Chest pain, patient currently taking wegovy
FINAL REPORT:
HISTORY: Chest pain.
PA and lateral chest:
The lungs are clear without focal interstitial process, consolidation, or effusion. Cardiomediastinal contours are within normal limits without pneumothorax. The imaged bony thorax is unremarkable.

[left lateral (1 of 2)]
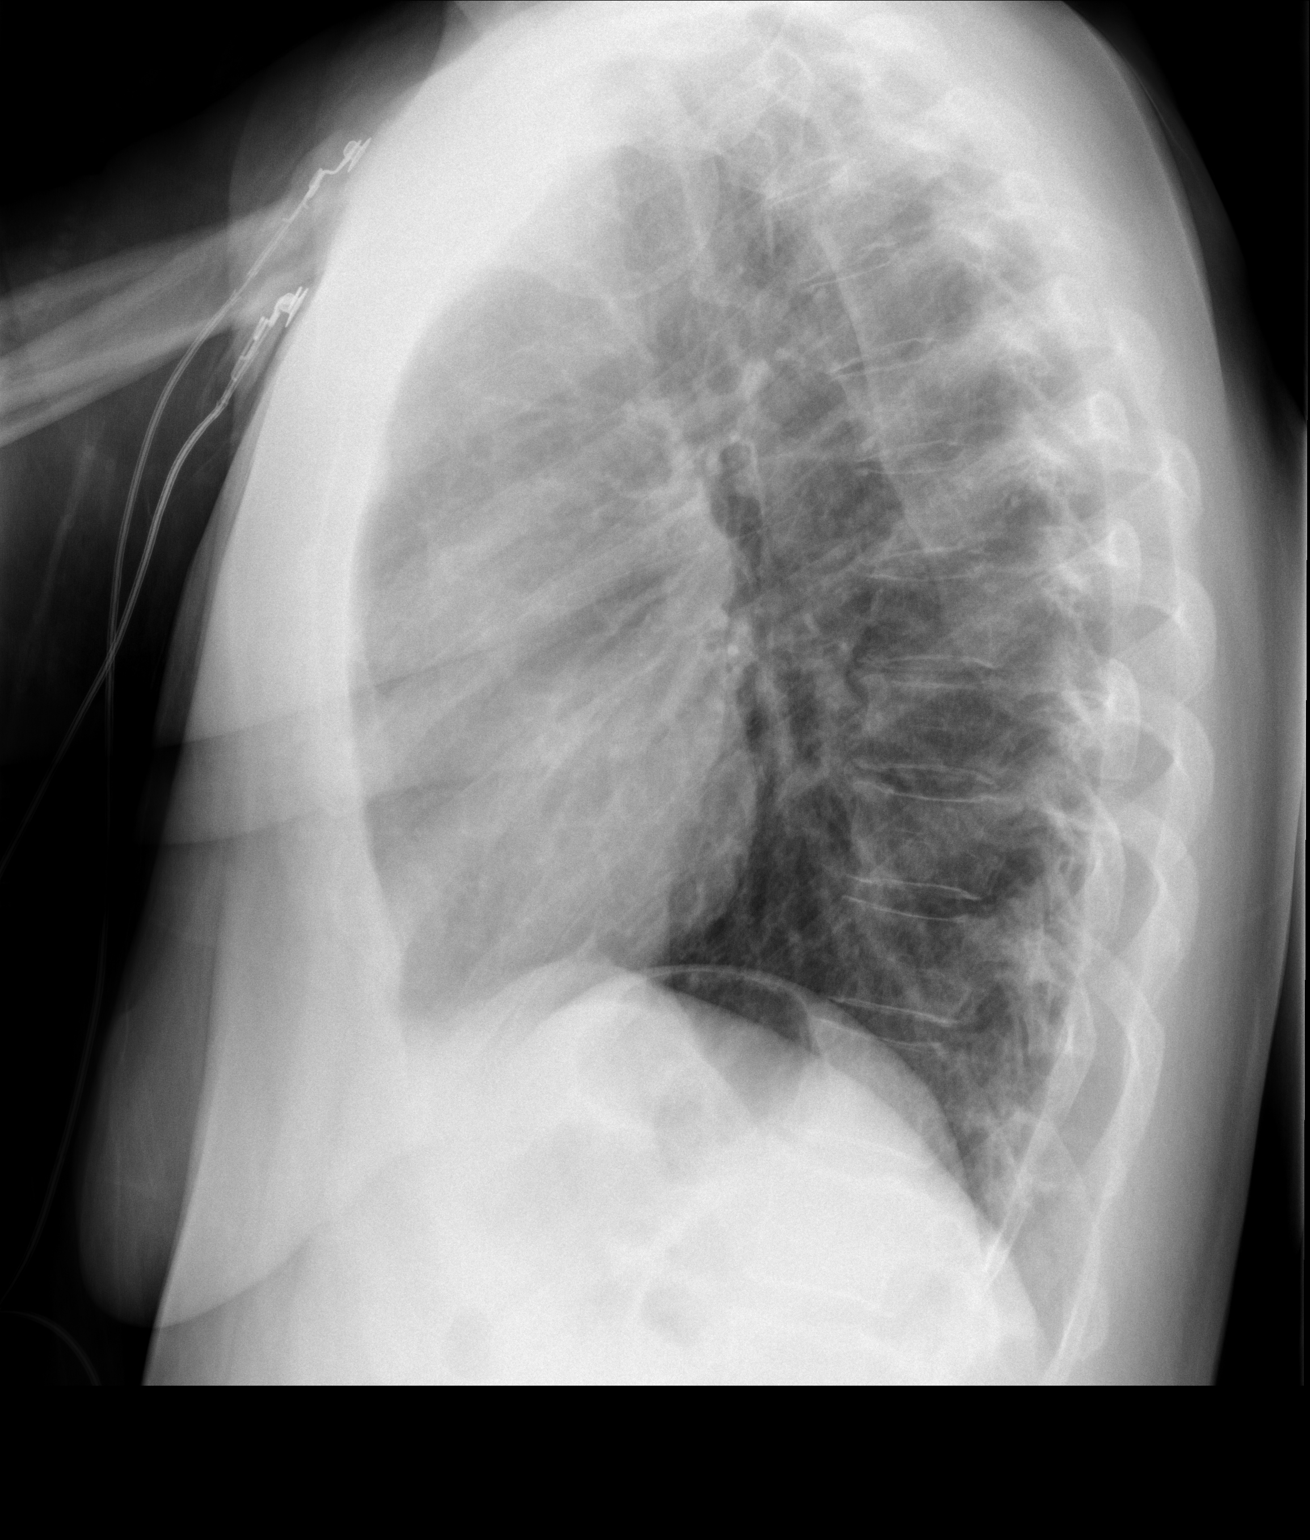

[left lateral (2 of 2)]
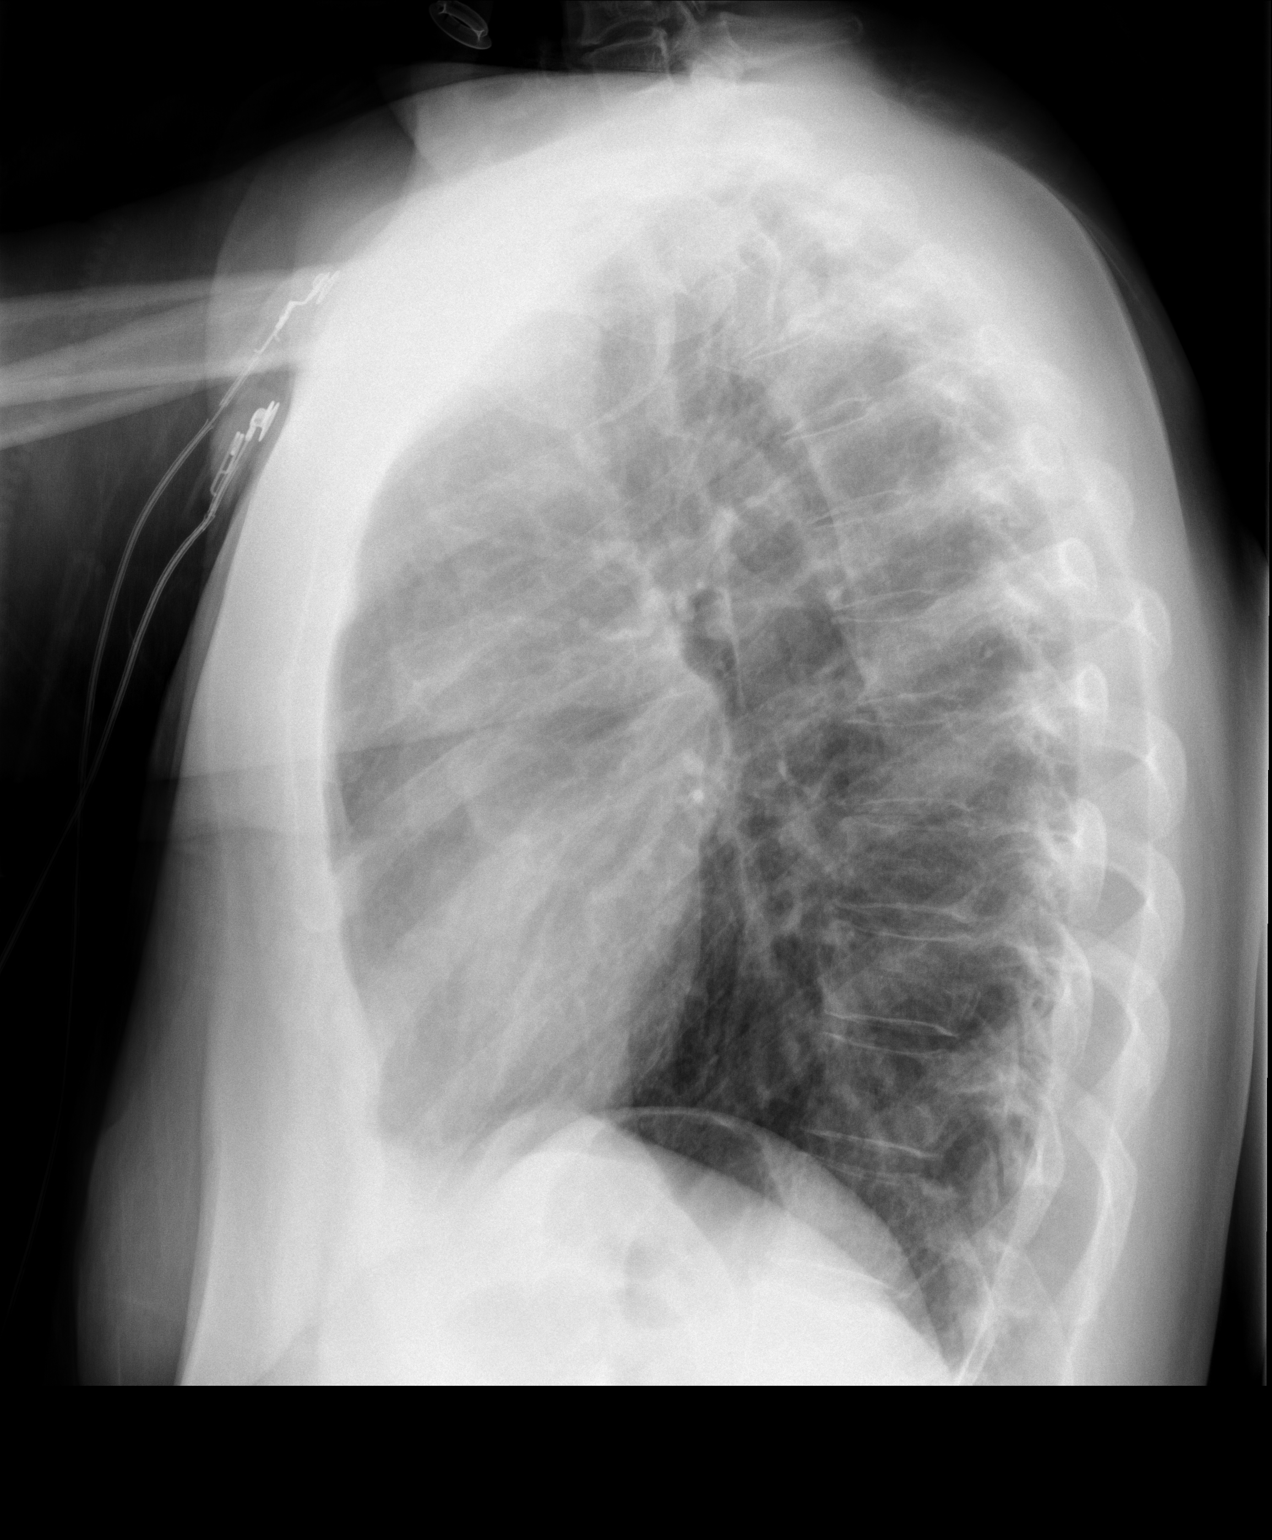

[AP]
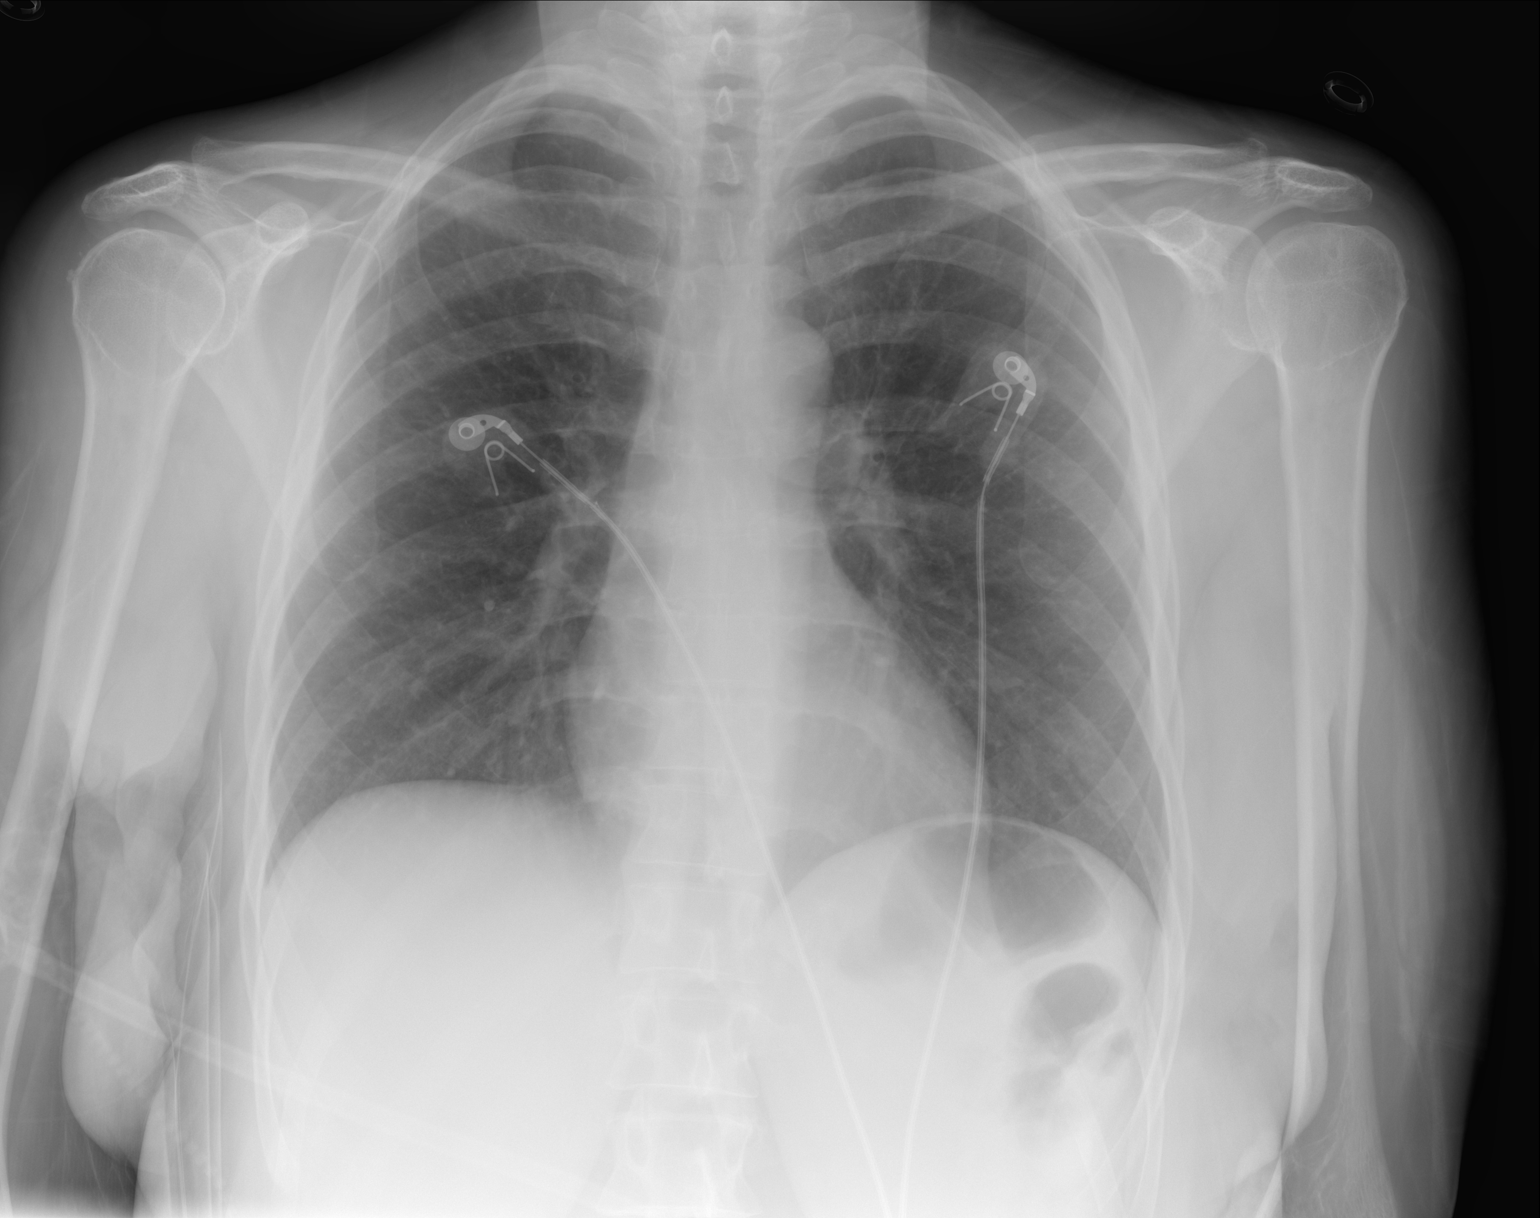

[3 of 3 positions shown; findings below may reference images not displayed]

IMPRESSION: No acute cardiopulmonary abnormality.
Is the patient pregnant?
No

## 2021-10-07 IMAGING — CT CT ABDOMEN WITHOUT CONTRAST
2 of 3 series · 17 of 46 positions shown, 19 images · non-contrast
Comparison: None.

Pt stated hx of gastric bypass sx x 5 years ago and skin flap sx x 3 years ago   pt recently lost 37 lbs on wegovy shot and is battling constipation  no bm x 6 days    no hx of cancer
FINAL REPORT:
CT ABDOMEN WITHOUT CONTRAST
INDICATION: Right-sided abdominal pain, nausea and vomiting.
TECHNIQUE: CT of the abdomen and pelvis was performed without contrast. Coronal and sagittal reformats were submitted for evaluation.
DLP: 286.69 mGy-cm

[Series 2: renal stone · axial · 0.70mm/px · z∈[-455,-47]mm · 14 of 189 slices shown, 16 images]
[im 13/189  soft-tissue]
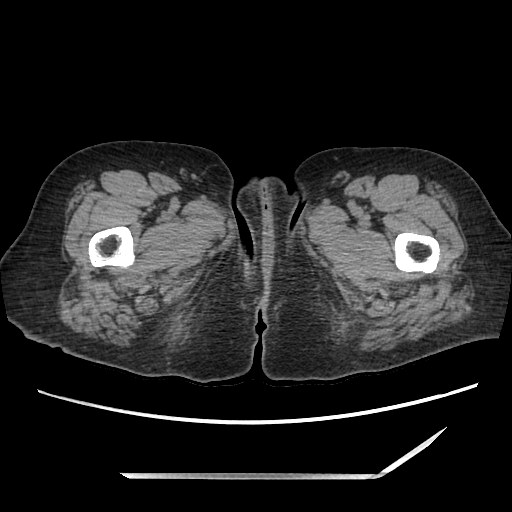
[im 13/189  bone]
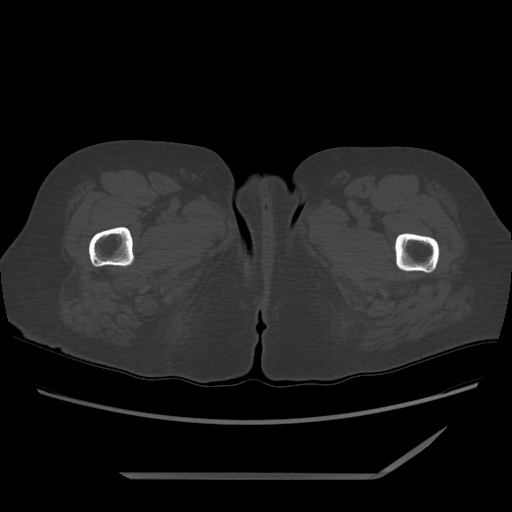
[im 25/189  soft-tissue]
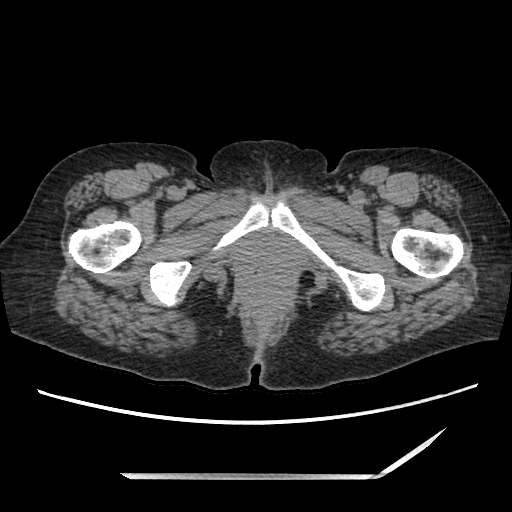
[im 37/189  soft-tissue]
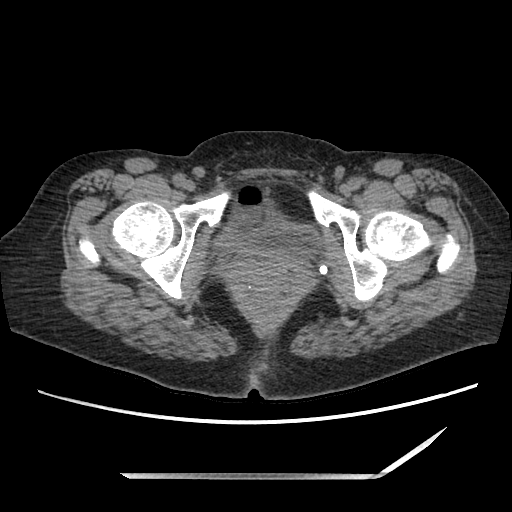
[im 49/189  soft-tissue]
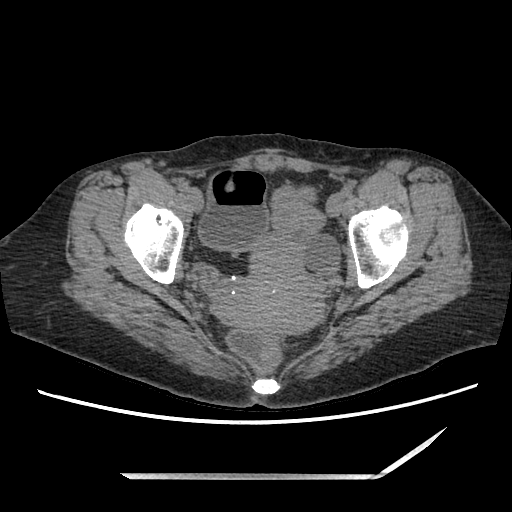
[im 61/189  soft-tissue]
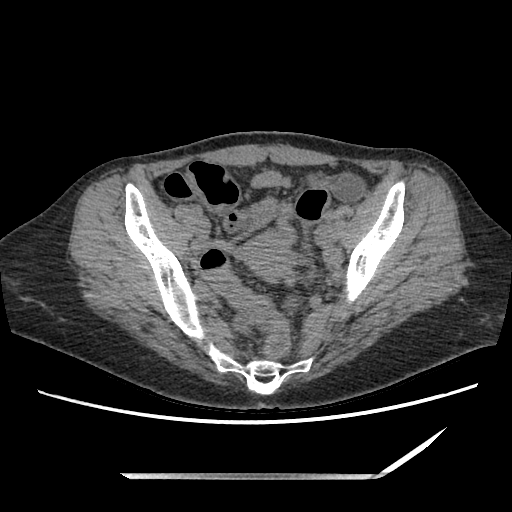
[im 73/189  soft-tissue]
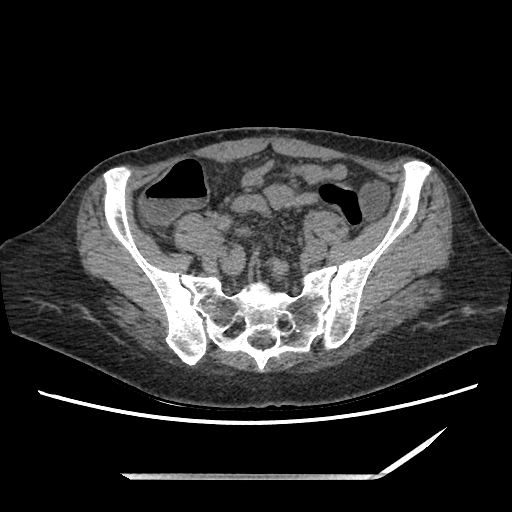
[im 85/189  soft-tissue]
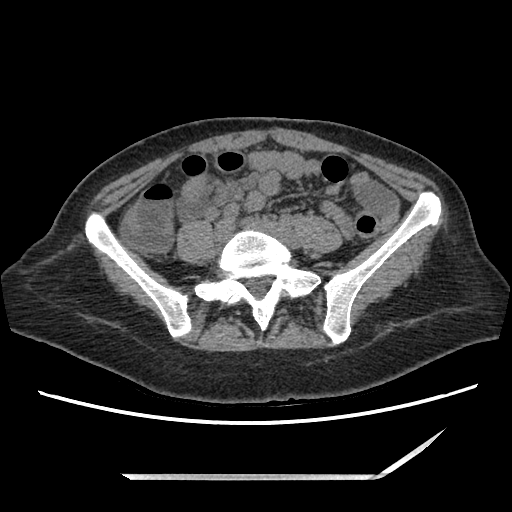
[im 104/189  soft-tissue]
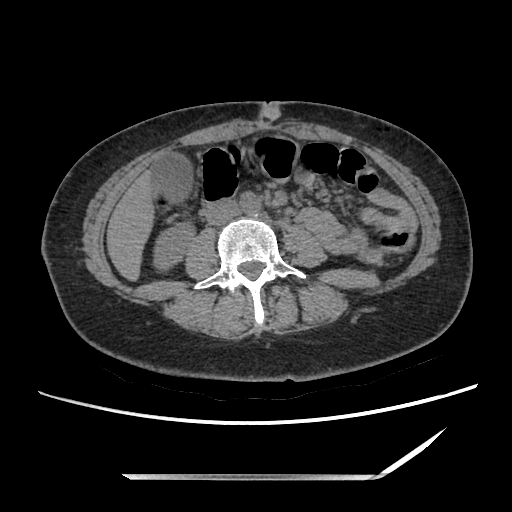
[im 116/189  soft-tissue]
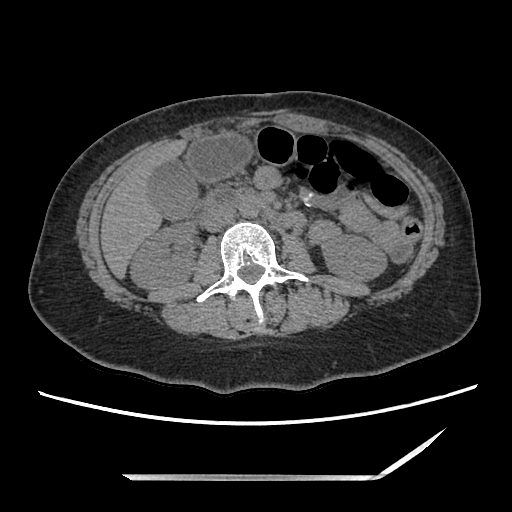
[im 116/189  bone]
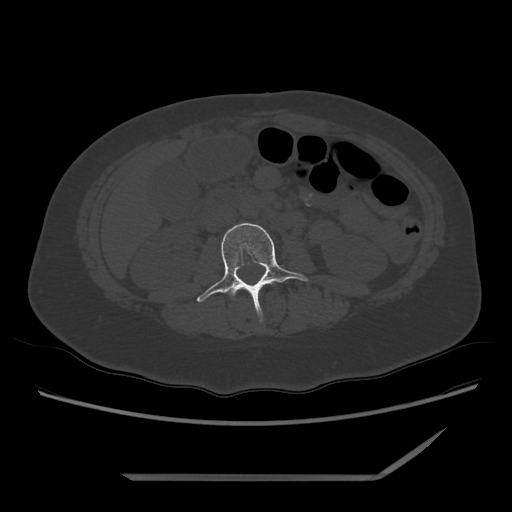
[im 128/189  soft-tissue]
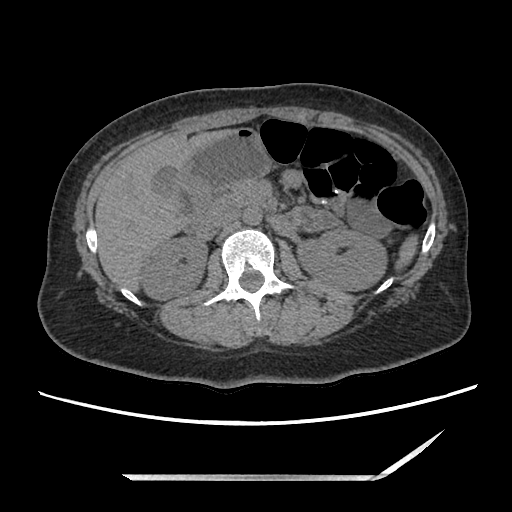
[im 140/189  soft-tissue]
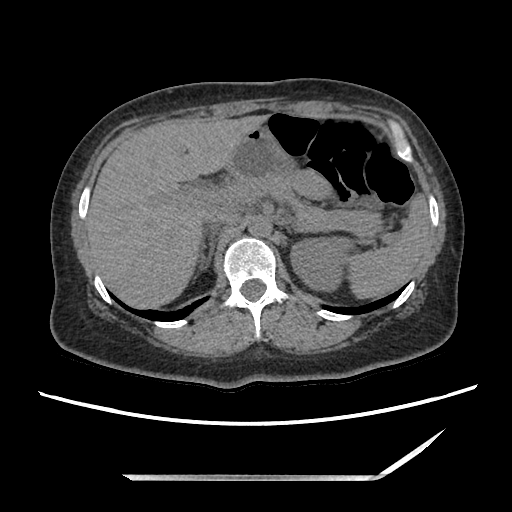
[im 152/189  soft-tissue]
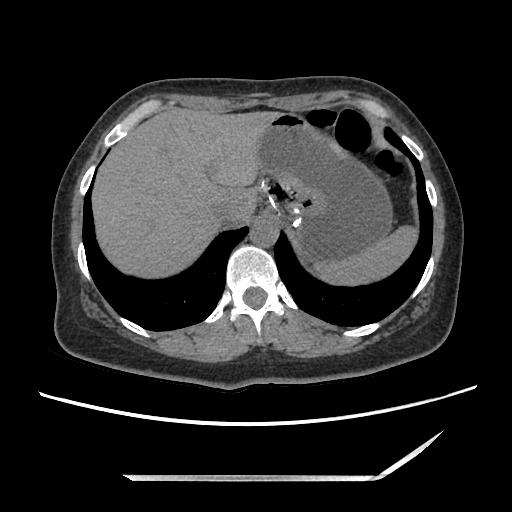
[im 164/189  soft-tissue]
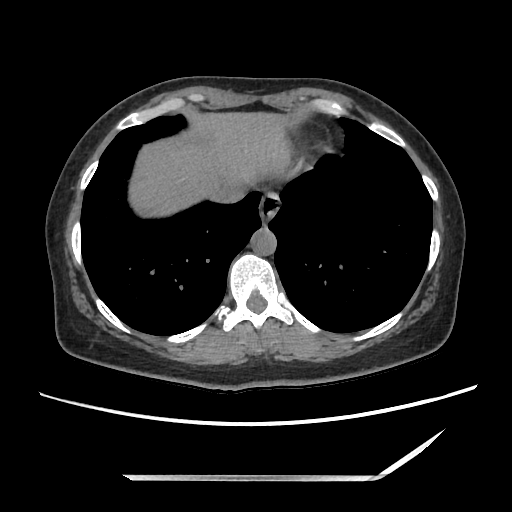
[im 176/189  soft-tissue]
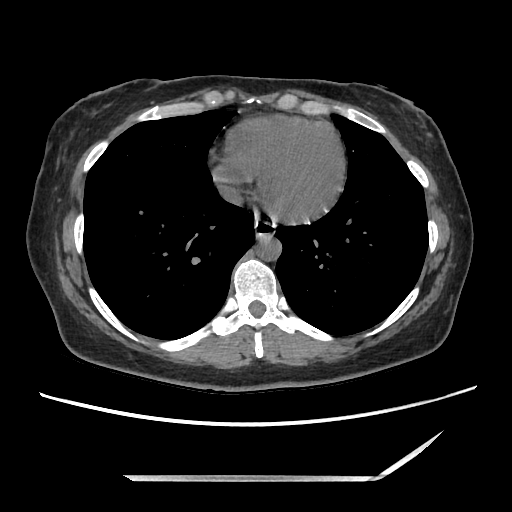

[Series 602: sag standard 2x2 · sagittal · 0.92mm/px · 3 of 181 slices shown]
[im 61/181  soft-tissue]
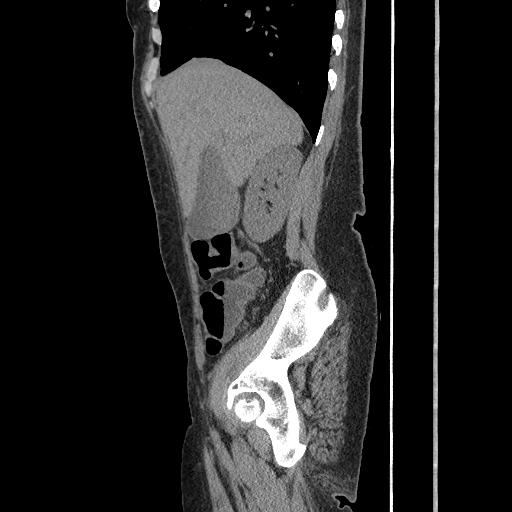
[im 81/181  soft-tissue]
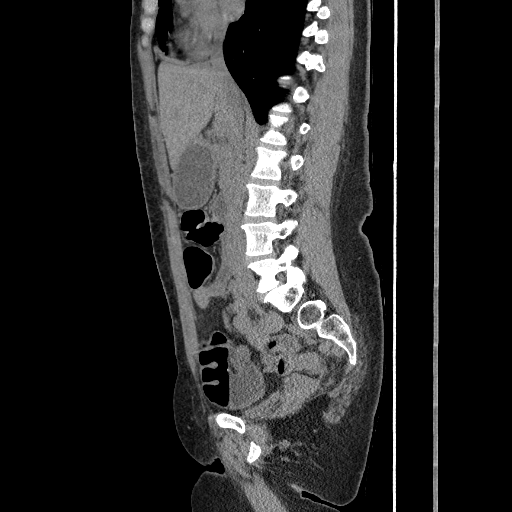
[im 101/181  soft-tissue]
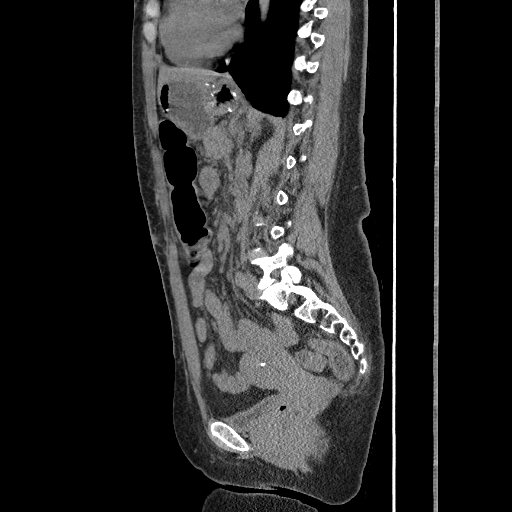

[17 of 46 positions shown; findings below may reference images not displayed]

FINDINGS: Lung Bases: Unremarkable.
Heart: Unremarkable.
Distal Esophagus and Stomach: Postsurgical changes from gastric bypass. The excluded stomach is mildly distended with fluid.
Small and Large Bowel: No small bowel obstruction. No evidence of acute appendicitis. Liquid stool in the colon.
Liver/Biliary: Normal appearance of the gallbladder. No biliary dilation. No focal hepatic lesions.
Spleen: Unremarkable.
Pancreas: Unremarkable.
Adrenal Glands: Unremarkable.
Kidneys: No urolithiasis, hydronephrosis, or perinephric inflammation.
Bladder: Unremarkable.
Reproductive: There is an IUD in the endometrial cavity. 2.8 cm left adnexal cyst. No suspicious adnexal masses.
Vascular: Normal caliber of the aorta. Mild atherosclerosis.
Lymph Nodes: No suspicious lymphadenopathy.
Soft Tissues: Unremarkable.
Bones: No suspicious lytic or blastic lesions.
IMPRESSION: 
IMPRESSION: 1. Liquid stool in the colon suggesting diarrheal illness.
2. Postsurgical changes from gastric bypass with mild distention of the excluded stomach with fluid.
All CT scans at this facility use iterative reconstruction technique, dose modulation, and/or weight based dosing when appropriate to reduce radiation dose to as low as reasonably achievable.
Is the patient pregnant?
No

## 2022-02-11 IMAGING — US US LOW EXT VARICOSE VEINS BILAT
1 series · 14 of 16 positions shown · non-contrast
Comparison: none

FINAL REPORT:
REASON FOR EXAM: Evaluation of venous insufficiency
STUDY PERFORMED:  Bilateral lower extremity venous duplex with reflux evaluation.

[Series 1: us low ext varicose veins bilat · 14 of 51 slices shown]
[im 1/51]
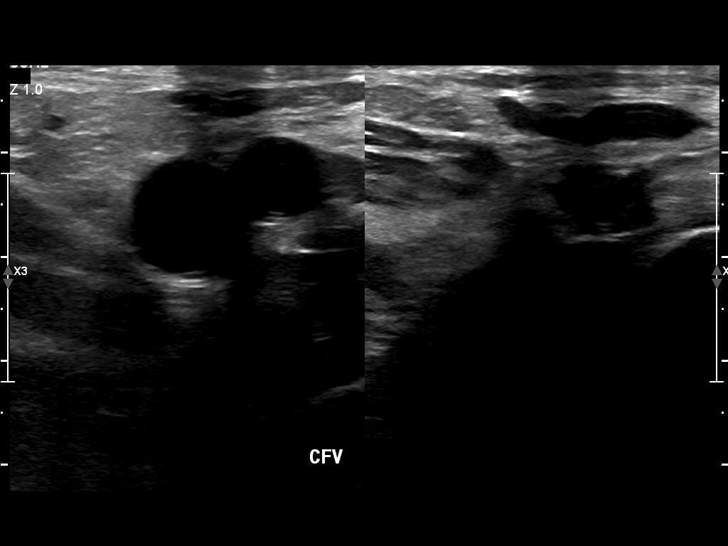
[im 4/51]
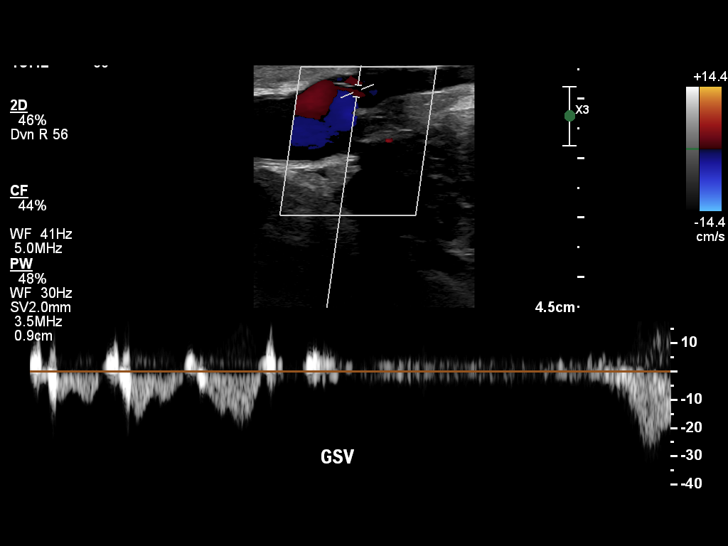
[im 7/51]
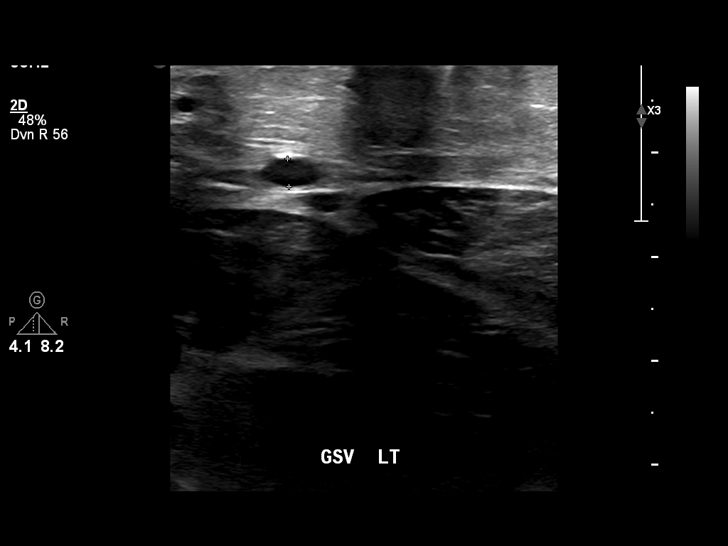
[im 14/51]
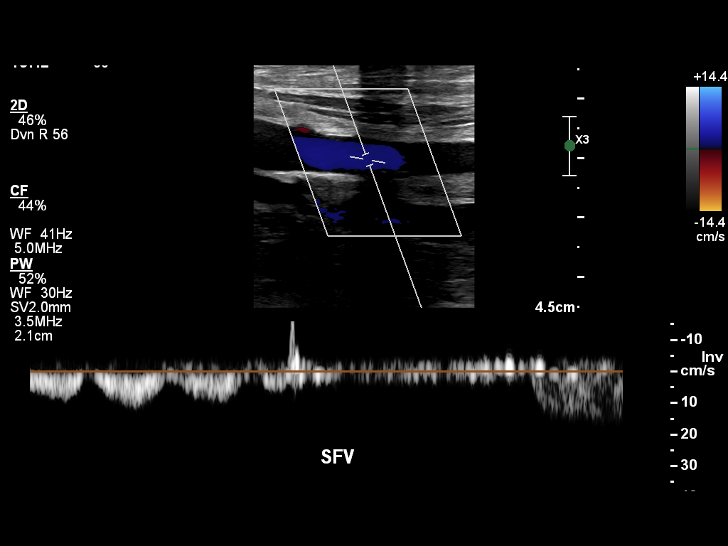
[im 17/51]
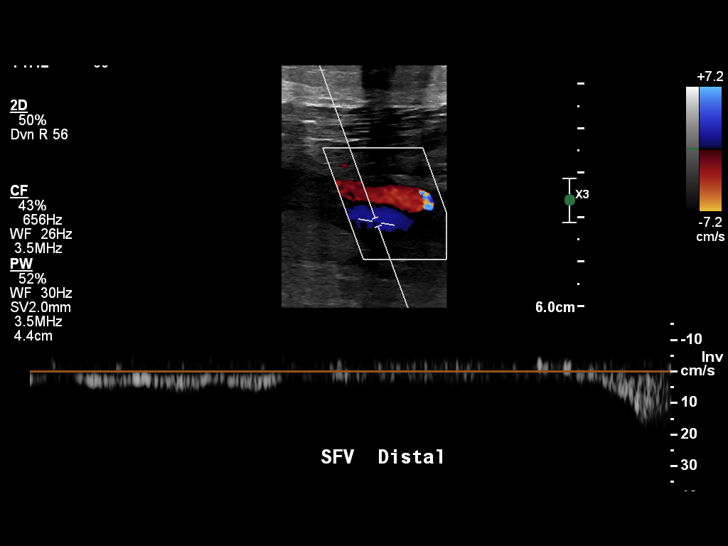
[im 21/51]
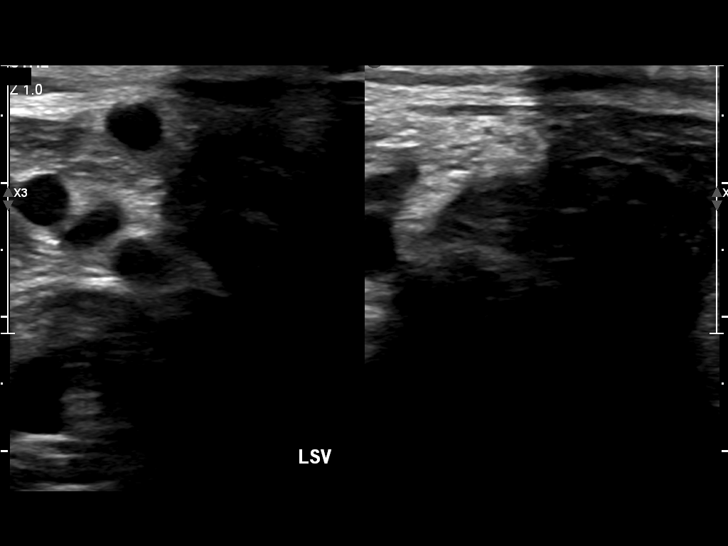
[im 24/51]
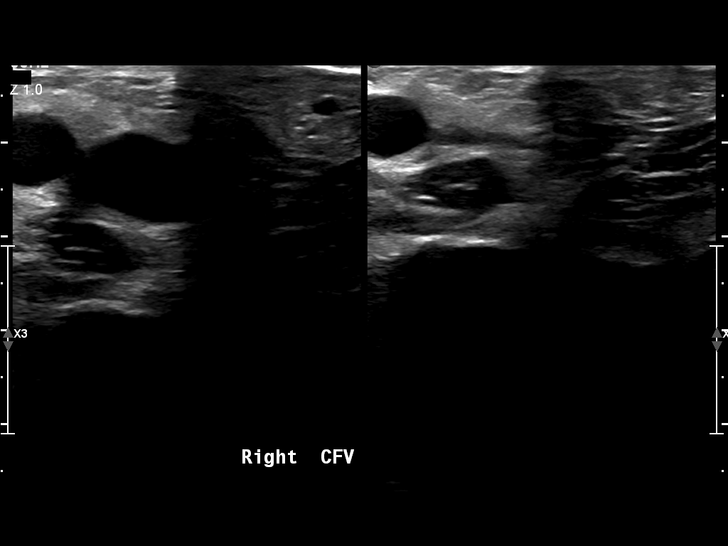
[im 27/51]
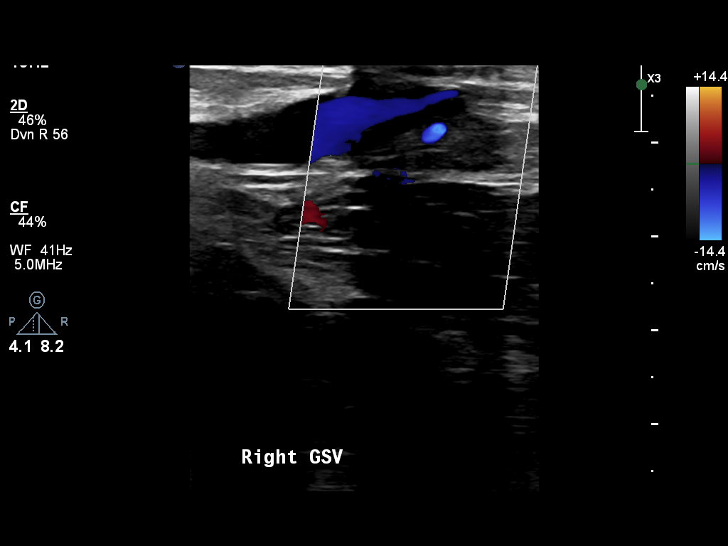
[im 31/51]
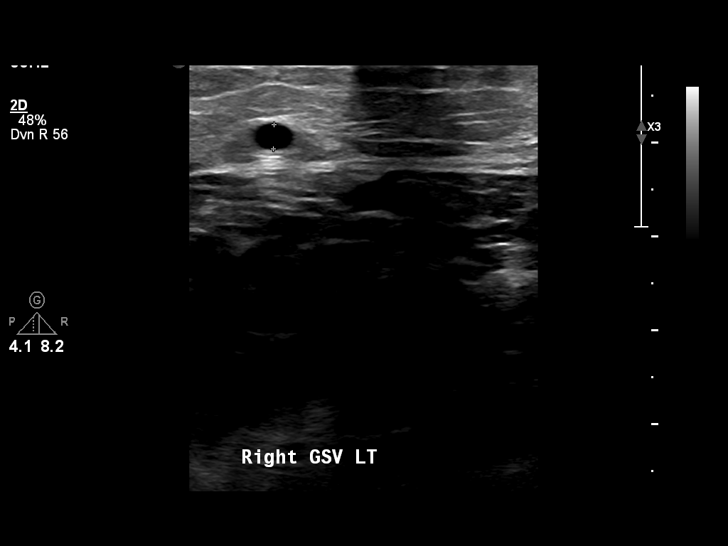
[im 34/51]
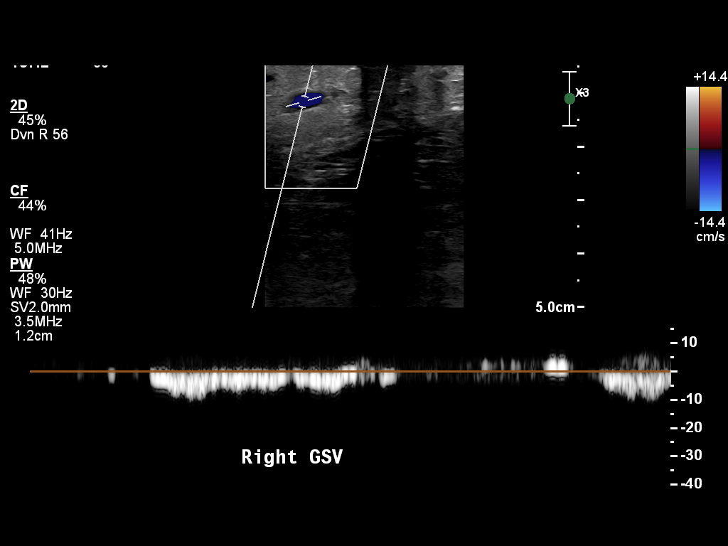
[im 41/51]
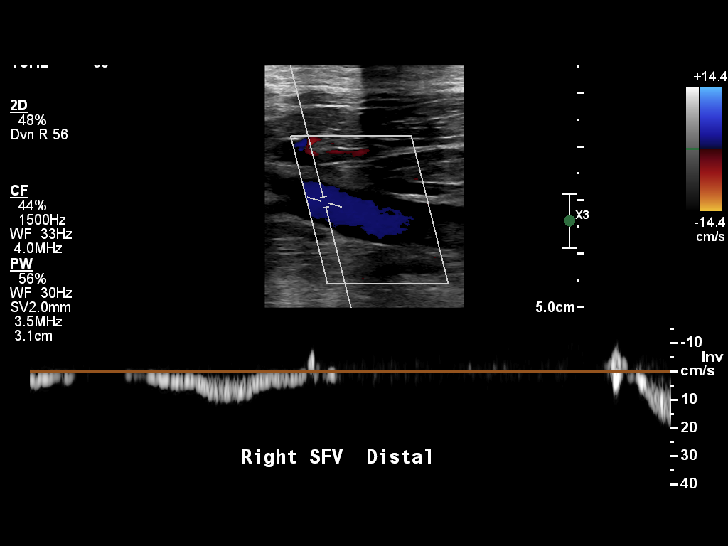
[im 44/51]
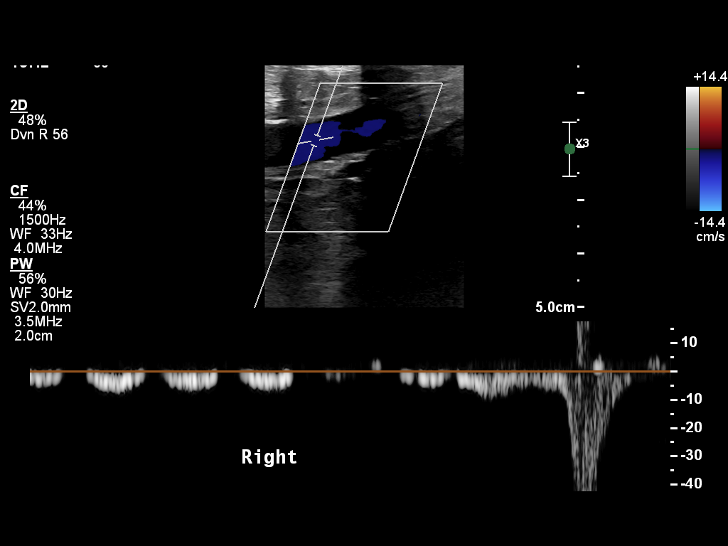
[im 47/51]
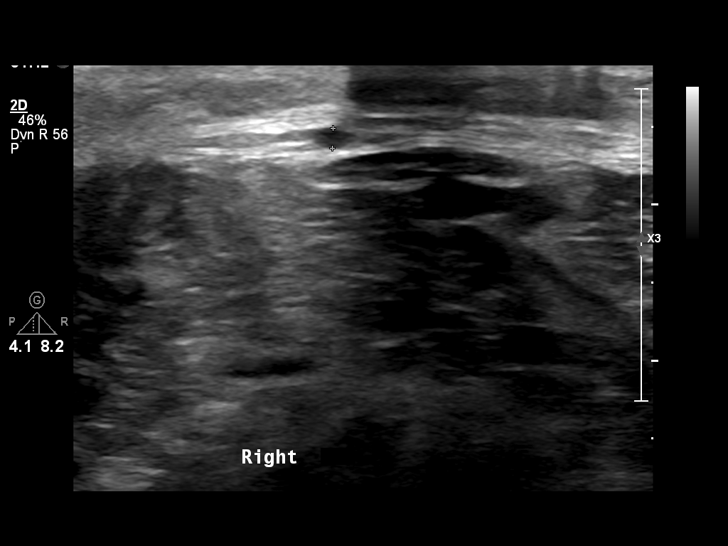
[im 51/51]
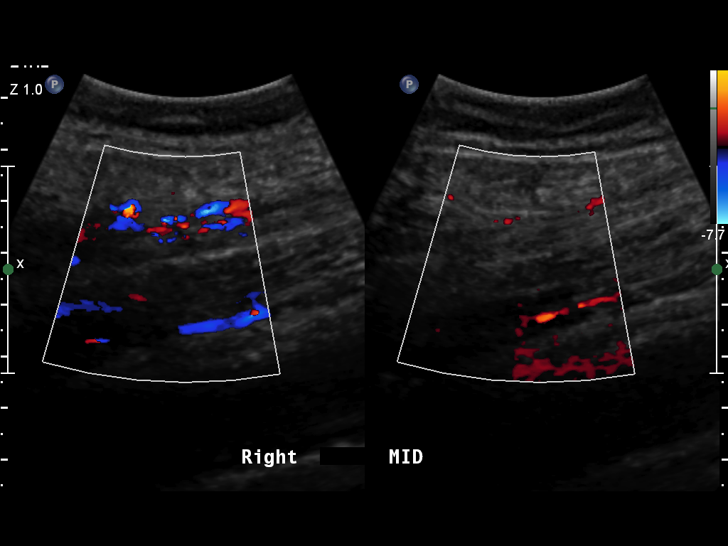

[14 of 16 positions shown; findings below may reference images not displayed]

FINDINGS: Right lower extremity deep veins are all examined including the common femoral vein, femoral vein in the thigh, popliteal vein, posterior tibial vein, and peroneal veins.  All are
found to be free of DVT and reflux.   See chart for documented diameters..
IMPRESSION: 1.  There is no evidence of deep vein thrombosis or reflux in bilateral lower extremities.

## 2022-04-21 ENCOUNTER — Telehealth: Payer: Self-pay | Admitting: Surgery

## 2022-04-21 NOTE — Telephone Encounter (Signed)
MBSCR left message with clinic phone number encouraging patient to make follow-up appt.  Letter sent.

## 2022-11-18 IMAGING — MG MAMMO BREAST SCREENING TOMOSYNTHESIS 3D BILATERAL
8 of 16 series · 8 of 32 positions shown · non-contrast
Comparison: 05/23/2021.

This is a summary report. The complete report is available in the patient's medical record. If you cannot access the medical record, please contact the sending organization for a detailed fax or copy.
FINAL REPORT:
EXAM: MAMMO BREAST SCREENING TOMOSYNTHESIS 3D BILATERAL
CLINICAL HISTORY: Screening mammogram. No complaints. History of bilateral breast reduction and breast augmentation performed 06/13/2022. This is the patient's first postoperative mammogram.
TECHNIQUE: MLO and CC views of both breasts with tomosynthesis were obtained on a digital full-field mammographic unit. Bilateral dedicated implant displaced views were also performed. This examination was interpreted in conjunction with computer aided detection system. Nipple and skin markers were placed as needed.

[R MLO (1 of 2)]
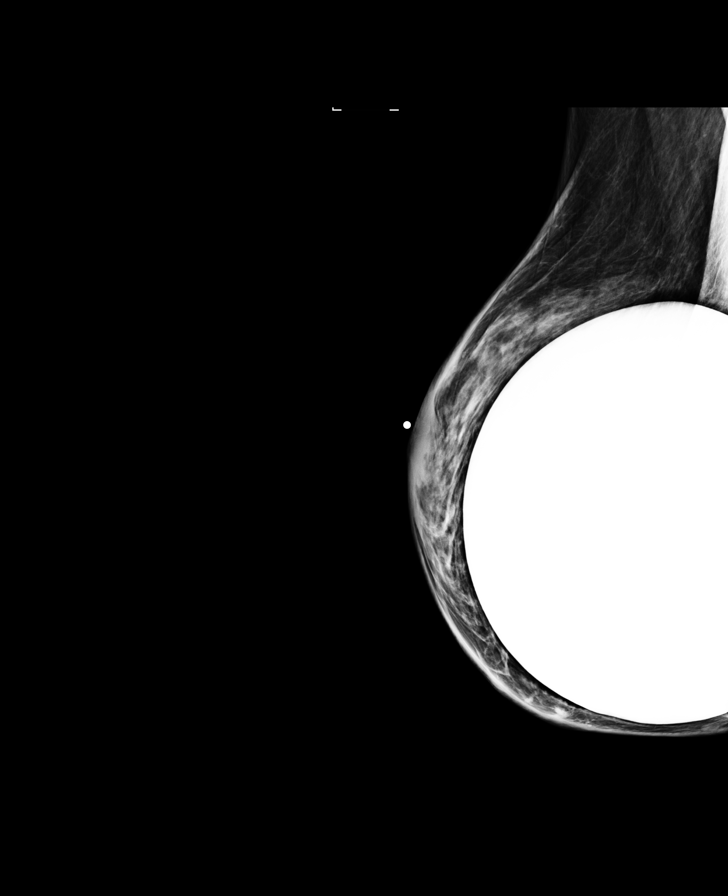

[L MLO]
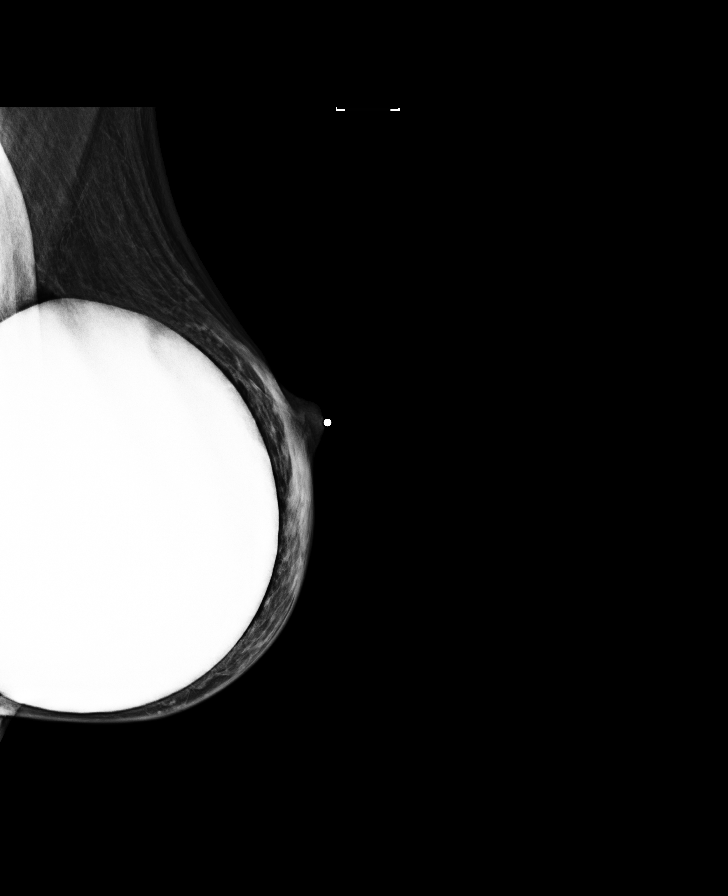

[L CC]
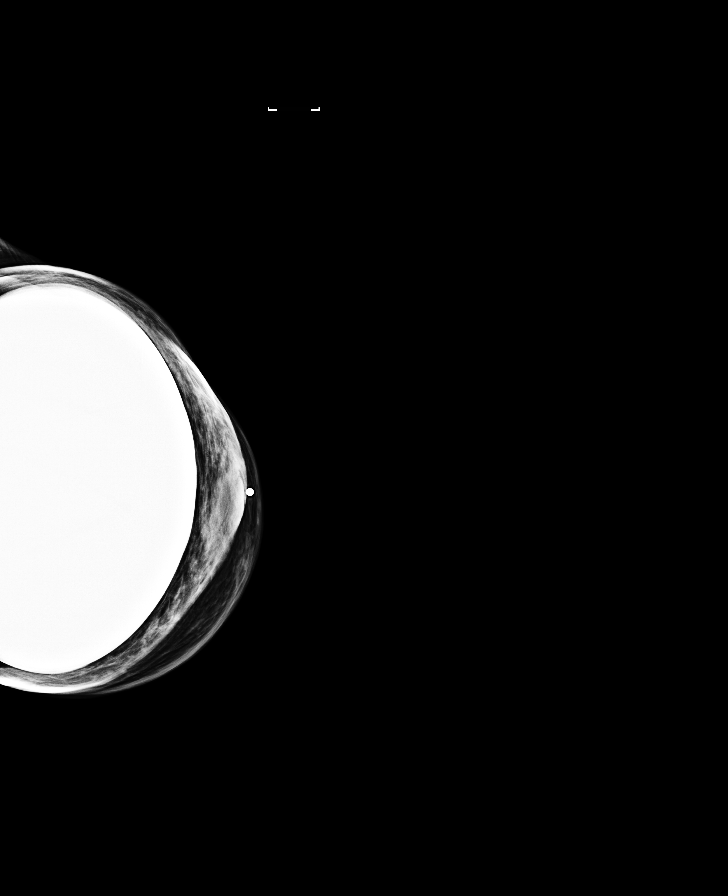

[R CC (1 of 2)]
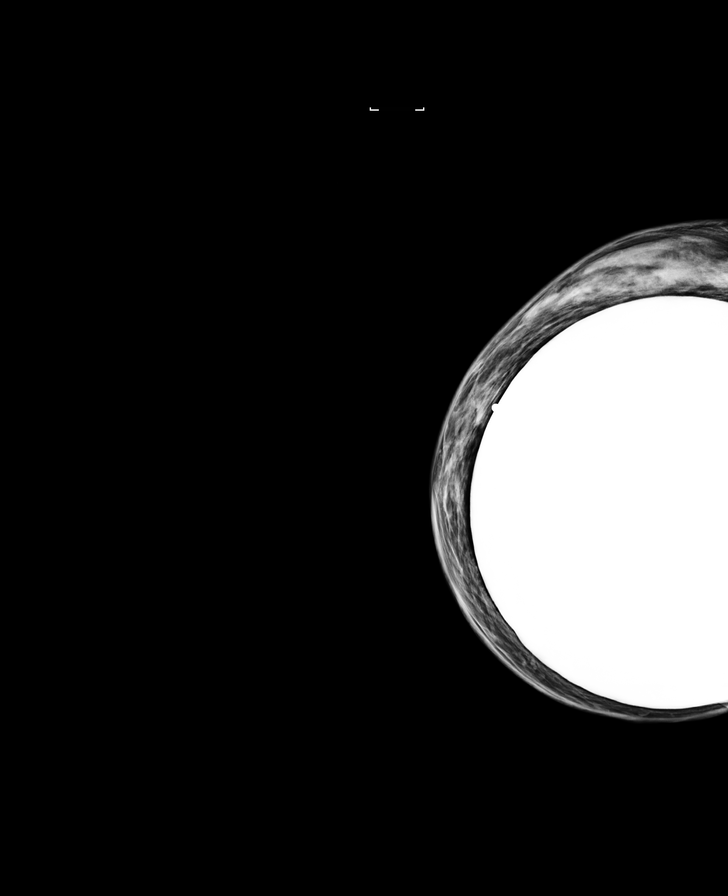

[R MLO synth-2D]
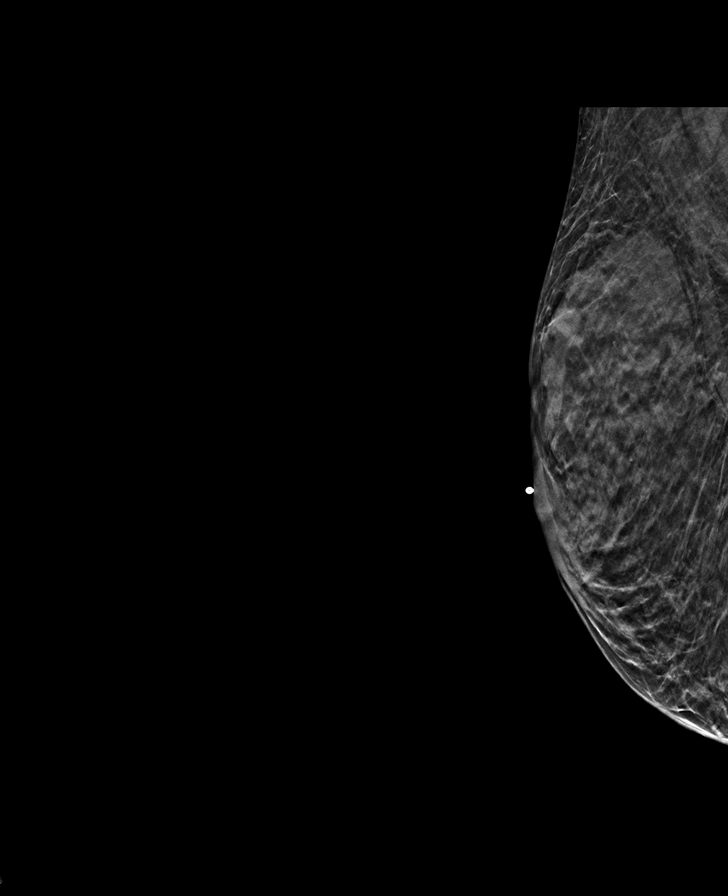

[R CC (2 of 2)]
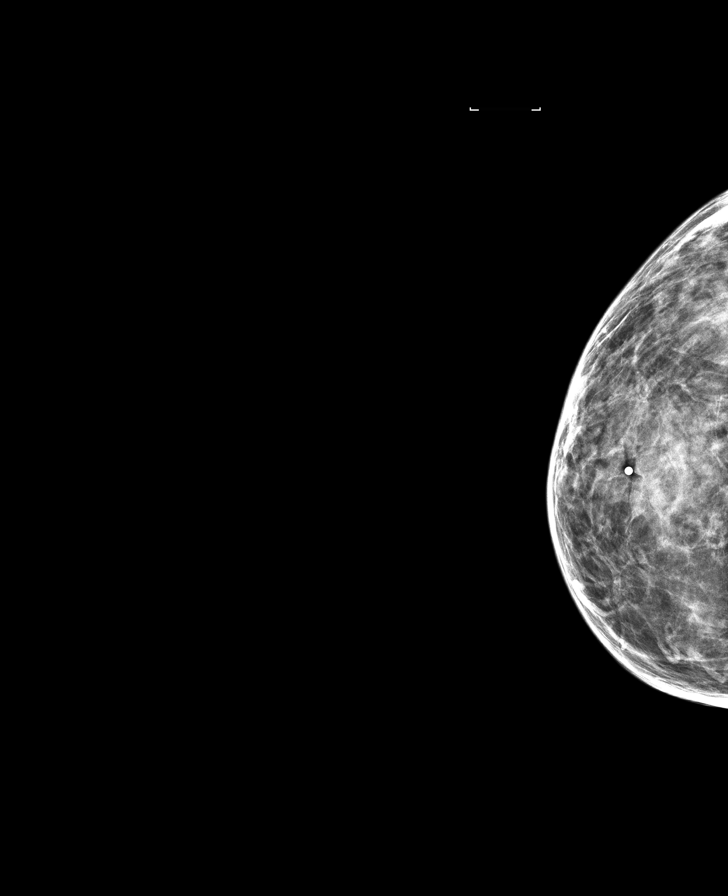

[R CC synth-2D]
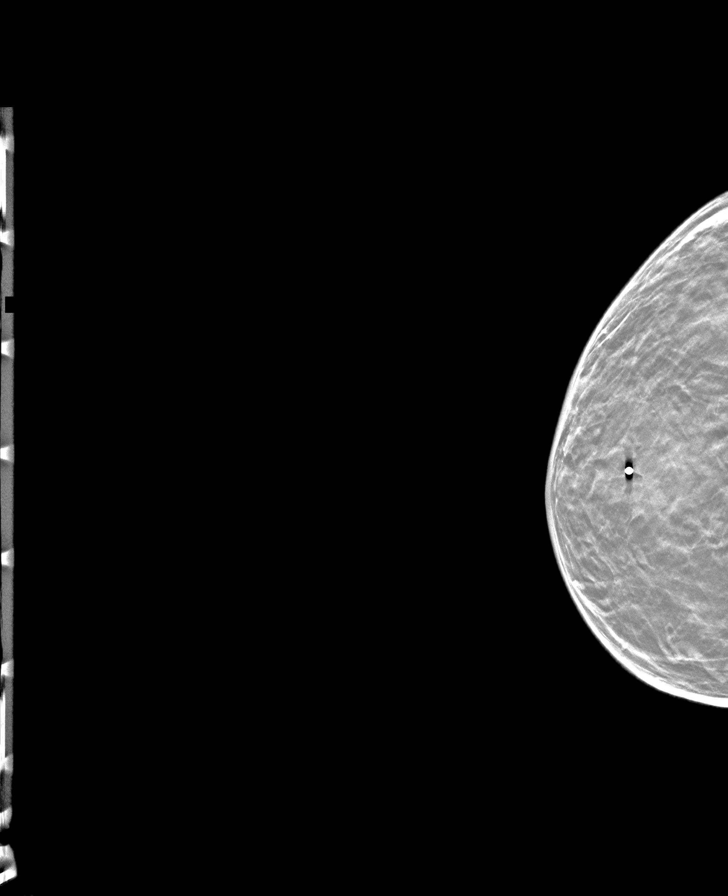

[R MLO (2 of 2)]
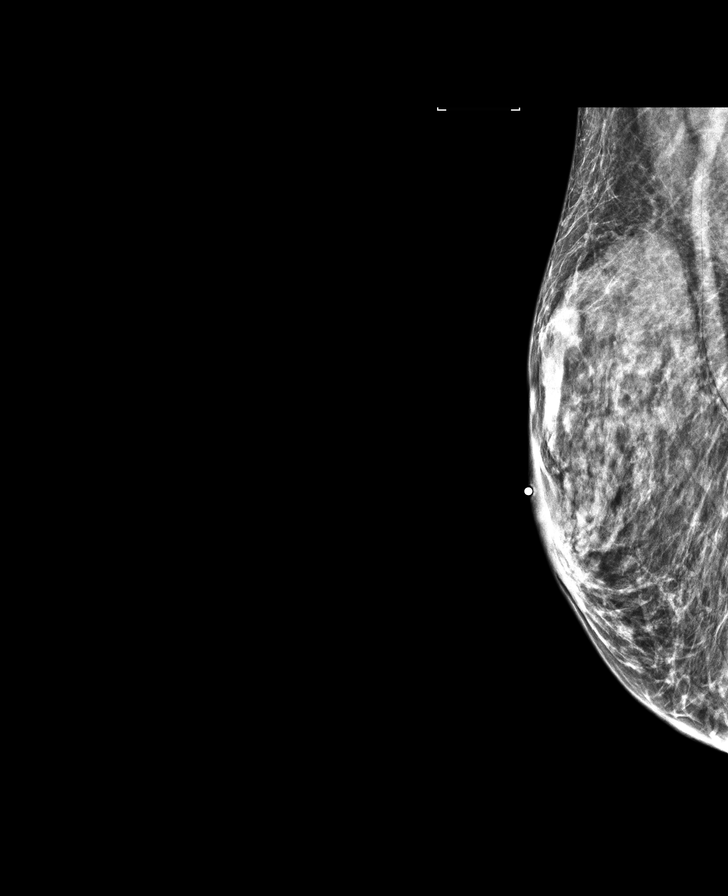

[8 of 32 positions shown; findings below may reference images not displayed]

FINDINGS: BREAST DENSITY TYPE D: The breasts are extremely dense, which lowers the sensitivity of mammography.
The skin, nipples, and both axillae are unremarkable. There are new bilateral intact prepectoral silicone breast implants. There is no evidence of a dominant mass, suspicious cluster of calcifications, or architectural distortion in either breast.
IMPRESSION: No mammographic evidence of malignancy.
Recommendation: Bilateral screening mammogram in one year.
BI-RADS Category 2: Benign
The patient information will be entered into a reminder system with a target due date for the next mammogram.

## 2023-05-04 IMAGING — MR MRI BRAIN WITHOUT CONTRAST
9 of 11 series · 37 of 48 positions shown · non-contrast
Comparison: None.

dizziness
FINAL REPORT:
MRI BRAIN WITHOUT CONTRAST
INDICATION: Other symptoms and signs involving cognitive functions and awareness
Essential tremor
Dizziness and giddiness
TECHNIQUE: Multiplanar multisequence magnetic resonance imaging of the brain was performed without contrast.

[Series 2001: survey_mpr_sag · sagittal · 1.6mm · 1.60mm/px · 3 of 20 slices shown]
[im 1/20]
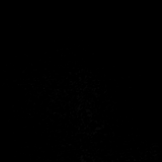
[im 10/20]
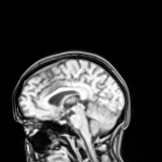
[im 20/20]
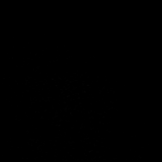

[Series 3001: survey_mpr_cor · coronal · 1.6mm · 1.60mm/px · 3 of 21 slices shown]
[im 1/21]
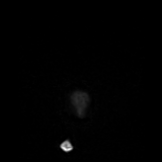
[im 11/21]
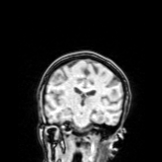
[im 21/21]
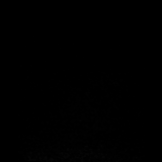

[Series 4001: survey_mpr_tra · axial · 1.6mm · 1.60mm/px · z∈[-110,+130]mm · 3 of 19 slices shown]
[im 1/19]
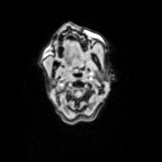
[im 10/19]
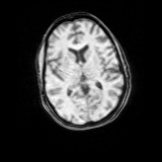
[im 19/19]
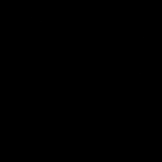

[Series 5001: T1 · sagittal · 5.0mm · 0.40mm/px · 4 of 27 slices shown (1 of 2)]
[im 1/27]
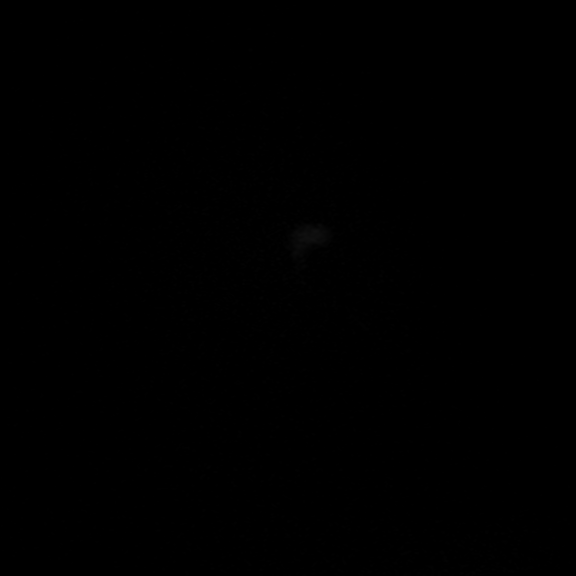
[im 9/27]
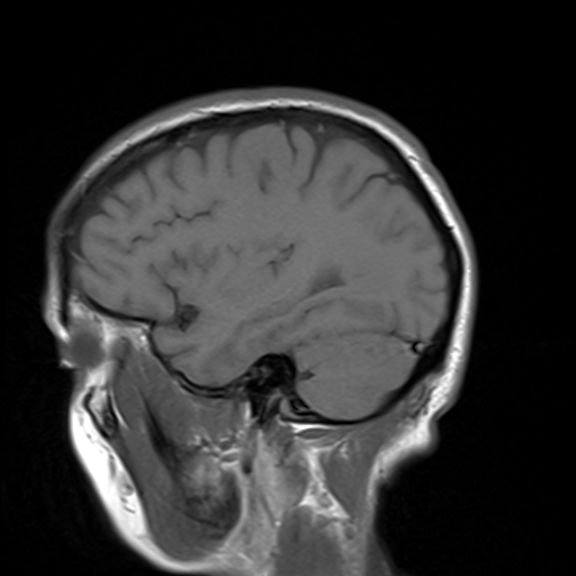
[im 18/27]
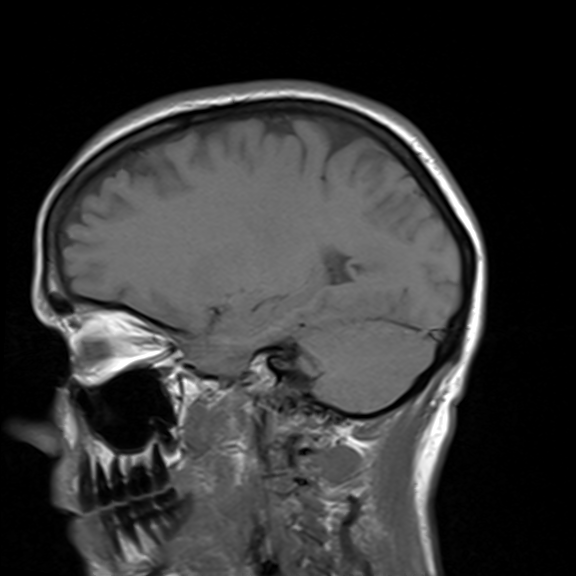
[im 27/27]
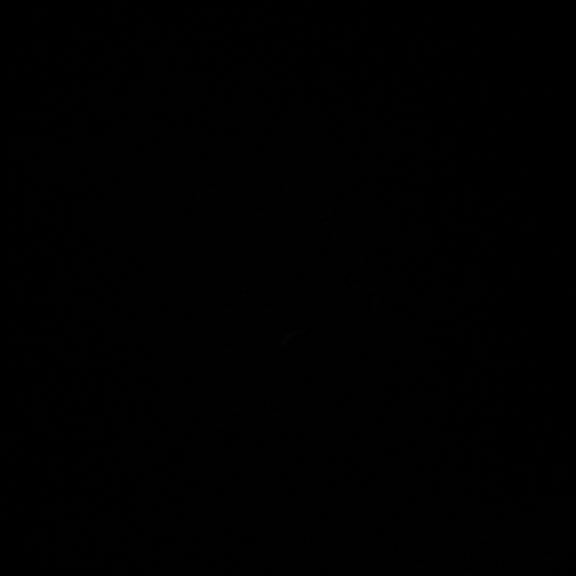

[Series 6002: ax dwi_tracew · axial · 5.0mm · 0.60mm/px · z∈[-127,-5]mm · 4 of 29 slices shown]
[im 1/29]
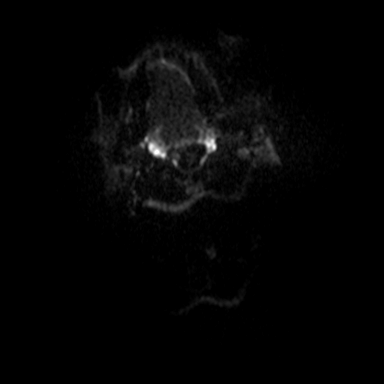
[im 8/29]
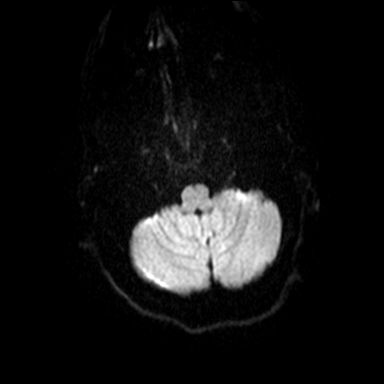
[im 15/29]
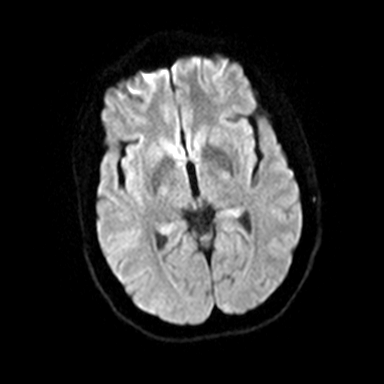
[im 22/29]
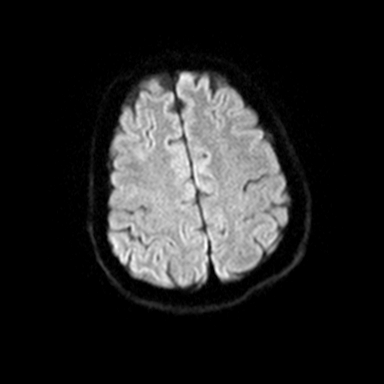

[Series 9001: T2 · axial · 5.0mm · 0.51mm/px · z∈[-128,+35]mm · 5 of 29 slices shown]
[im 1/29]
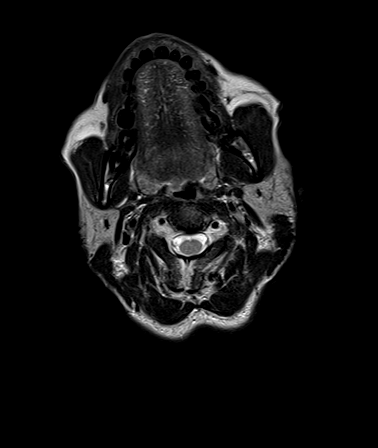
[im 8/29]
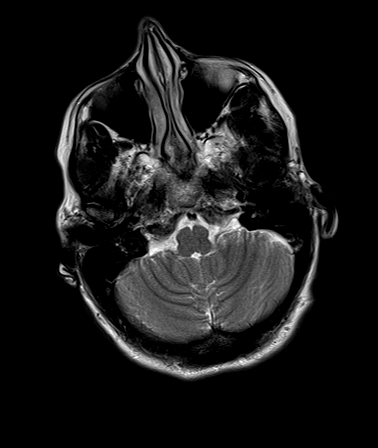
[im 15/29]
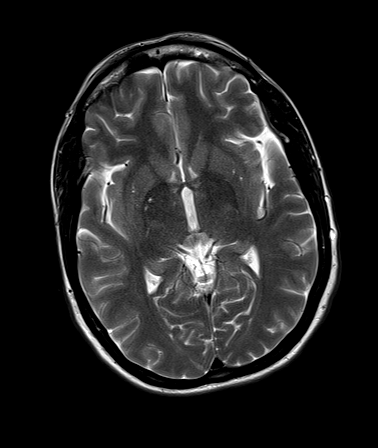
[im 22/29]
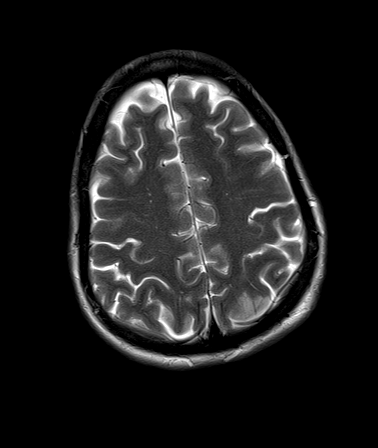
[im 29/29]
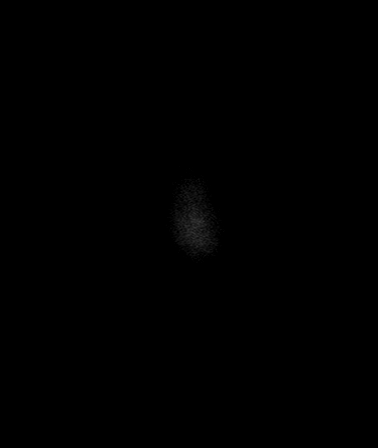

[T1 · axial · 5.0mm · 0.72mm/px · z∈[-128,+35]mm · 5 of 29 slices shown (2 of 2)]
[im 1/29]
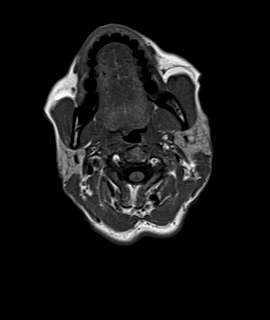
[im 8/29]
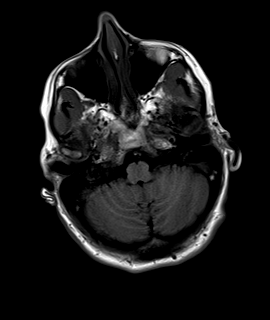
[im 15/29]
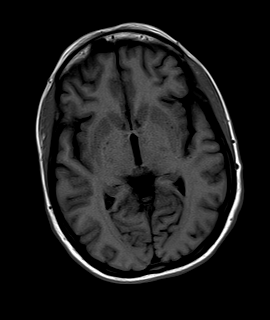
[im 22/29]
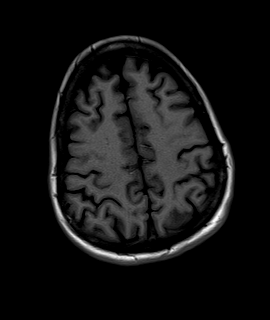
[im 29/29]
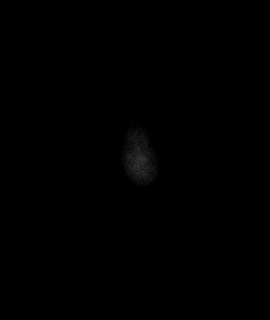

[FLAIR · axial · 5.0mm · 0.72mm/px · z∈[-128,+35]mm · 5 of 29 slices shown]
[im 1/29]
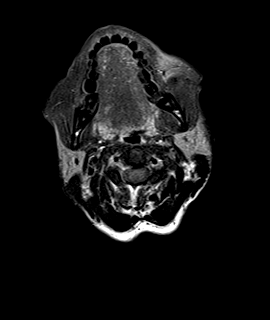
[im 8/29]
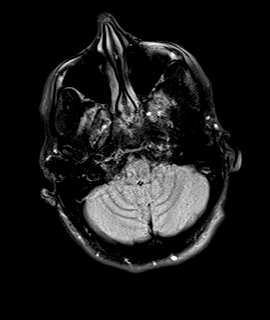
[im 15/29]
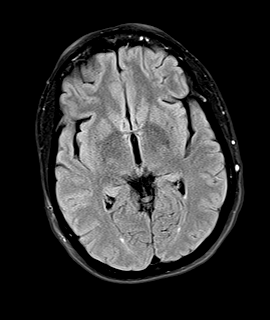
[im 22/29]
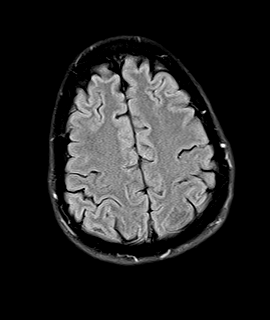
[im 29/29]
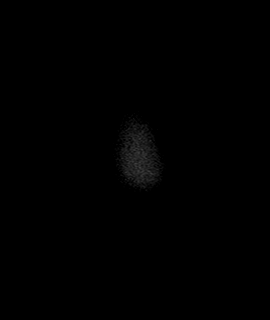

[GRE · axial · 5.0mm · 0.45mm/px · z∈[-128,+35]mm · 5 of 29 slices shown]
[im 1/29]
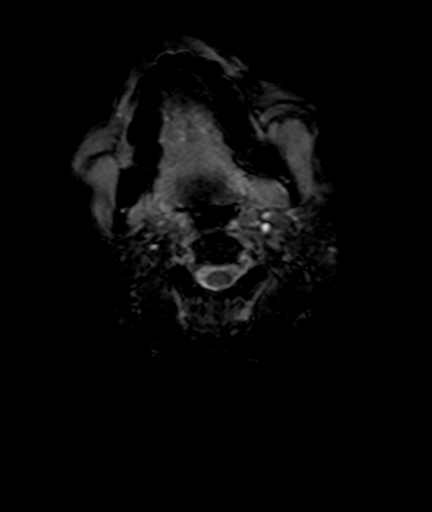
[im 8/29]
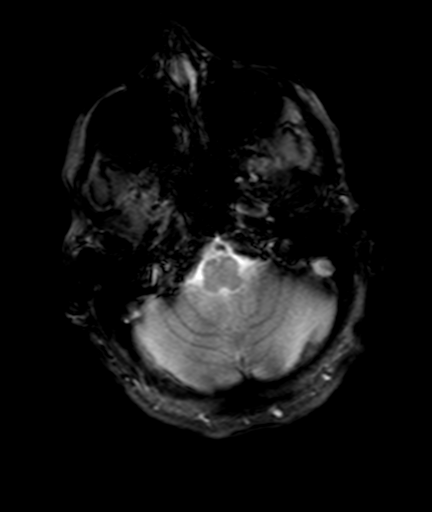
[im 15/29]
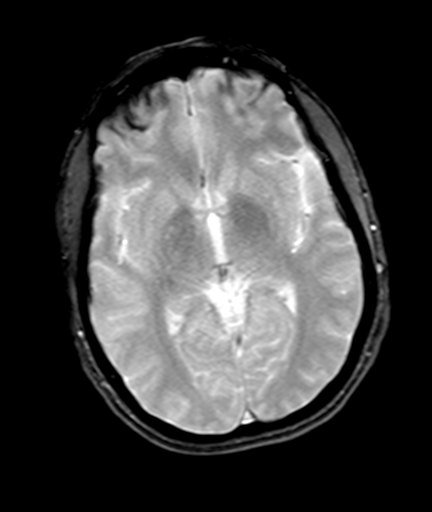
[im 22/29]
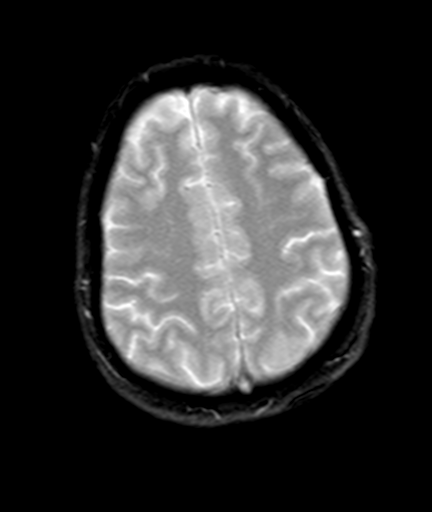
[im 29/29]
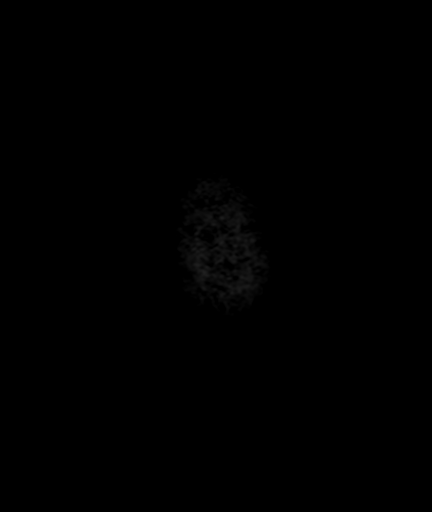

[37 of 48 positions shown; findings below may reference images not displayed]

FINDINGS: Surgical/Devices: None
Parenchyma: No infarction on DWI. No acute intraparenchymal hemorrhage. No mass or mass effect.
Extra-axial Collection: None
Ventricular System: No hydrocephalus.
Major Intracranial Flow Voids: Flow voids are preserved.
Osseous Structures/Soft Tissues: Expected marrow signal. No soft tissue abnormality. Degenerative changes of the upper cervical spine.
Included Orbits: Unremarkable
Paranasal Sinuses: Predominantly clear
Tympanomastoid Cavities: Predominantly clear.
IMPRESSION: No acute intracranial abnormality.
Is the patient pregnant?
No

## 8386-11-09 DEATH — deceased
